# Patient Record
Sex: Female | Born: 1975 | Race: Black or African American | Hispanic: No | Marital: Single | Smoking: Former smoker
Health system: Southern US, Community
[De-identification: ages and names within clinical notes are randomized; demographics above are authoritative.]

## PROBLEM LIST (undated history)

## (undated) DIAGNOSIS — R51 Headache: Secondary | ICD-10-CM

## (undated) DIAGNOSIS — R87629 Unspecified abnormal cytological findings in specimens from vagina: Secondary | ICD-10-CM

## (undated) DIAGNOSIS — E079 Disorder of thyroid, unspecified: Secondary | ICD-10-CM

## (undated) DIAGNOSIS — G43909 Migraine, unspecified, not intractable, without status migrainosus: Secondary | ICD-10-CM

## (undated) DIAGNOSIS — T7840XA Allergy, unspecified, initial encounter: Secondary | ICD-10-CM

## (undated) DIAGNOSIS — R519 Headache, unspecified: Secondary | ICD-10-CM

## (undated) DIAGNOSIS — E039 Hypothyroidism, unspecified: Secondary | ICD-10-CM

## (undated) DIAGNOSIS — D573 Sickle-cell trait: Secondary | ICD-10-CM

## (undated) DIAGNOSIS — O009 Unspecified ectopic pregnancy without intrauterine pregnancy: Secondary | ICD-10-CM

## (undated) DIAGNOSIS — J45909 Unspecified asthma, uncomplicated: Secondary | ICD-10-CM

## (undated) DIAGNOSIS — F418 Other specified anxiety disorders: Secondary | ICD-10-CM

## (undated) HISTORY — DX: Sickle-cell trait: D57.3

## (undated) HISTORY — PX: HX ADENOIDECTOMY: SHX29

## (undated) HISTORY — DX: Hypothyroidism, unspecified: E03.9

## (undated) HISTORY — PX: HX TONSILLECTOMY: SHX27

## (undated) HISTORY — DX: Other specified anxiety disorders: F41.8

## (undated) HISTORY — DX: Unspecified ectopic pregnancy without intrauterine pregnancy: O00.90

## (undated) HISTORY — PX: HX TONSIL AND ADENOIDECTOMY: SHX28

## (undated) HISTORY — DX: Unspecified abnormal cytological findings in specimens from vagina: R87.629

## (undated) HISTORY — DX: Allergy, unspecified, initial encounter: T78.40XA

## (undated) HISTORY — DX: Headache, unspecified: R51.9

## (undated) HISTORY — DX: Disorder of thyroid, unspecified: E07.9

## (undated) HISTORY — DX: Migraine, unspecified, not intractable, without status migrainosus: G43.909

## (undated) HISTORY — DX: Headache: R51

---
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
## 2003-02-22 ENCOUNTER — Ambulatory Visit (HOSPITAL_COMMUNITY): Payer: Self-pay

## 2003-03-15 ENCOUNTER — Ambulatory Visit (HOSPITAL_COMMUNITY): Payer: Self-pay

## 2003-12-14 ENCOUNTER — Emergency Department (HOSPITAL_COMMUNITY): Payer: Self-pay

## 2004-01-06 DIAGNOSIS — R87629 Unspecified abnormal cytological findings in specimens from vagina: Secondary | ICD-10-CM

## 2004-01-06 HISTORY — PX: LEEP: SHX91

## 2004-01-06 HISTORY — DX: Unspecified abnormal cytological findings in specimens from vagina: R87.629

## 2004-09-04 ENCOUNTER — Encounter (FREE_STANDING_LABORATORY_FACILITY): Payer: Self-pay | Admitting: Pathology

## 2008-03-30 ENCOUNTER — Encounter (EMERGENCY_DEPARTMENT_HOSPITAL): Payer: Self-pay

## 2008-03-30 ENCOUNTER — Emergency Department
Admission: EM | Admit: 2008-03-30 | Discharge: 2008-03-30 | Disposition: A | Discharge status: Home / Self Care | Payer: Self-pay | Source: Emergency Department | Attending: Emergency Medicine-WVUH | Admitting: Emergency Medicine-WVUH

## 2008-03-30 ENCOUNTER — Emergency Department (EMERGENCY_DEPARTMENT_HOSPITAL): Payer: Self-pay

## 2008-03-30 DIAGNOSIS — M545 Low back pain, unspecified: Secondary | ICD-10-CM | POA: Insufficient documentation

## 2008-03-30 LAB — URINALYSIS MACROSCOPIC WITH REFLEX TO MICROSCOPIC URINALYSIS (CULTURE NOT PERFORMED)
BLOOD: NEGATIVE
GLUCOSE: NEGATIVE mg/dL
KETONES: NEGATIVE mg/dL
LEUKOCYTES: NEGATIVE
NITRITE: NEGATIVE
PH URINE: 6 (ref 5.0–8.0)
PROTEIN: NEGATIVE mg/dL
SPECIFIC GRAVITY, URINE: 1.01 (ref 1.005–1.030)
UROBILINOGEN: NORMAL mg/dL

## 2008-03-30 NOTE — ED Nurses Note (Signed)
Written and verbal DC orders given with scripts.  Pt verbalizes understanding and denies questions.  DC pt home.

## 2008-03-30 NOTE — ED Nurses Note (Signed)
Pt to xray with PA.

## 2008-03-30 NOTE — ED Nurses Note (Signed)
Pt presents to ED c/o lower back pain since manually lifting garage door 2 days ago.  C/o pain down left leg with numbness and tingling.  Denies urinary changes but states no BM x 2 days.  Awaiting orders.

## 2008-03-30 NOTE — ED Provider Notes (Addendum)
HPI  33 yo F c/o low back pain x 2 days.  Pt states she attempted to open a garage door and has pain in her low back since.  Denies radiation, sensory change or weakness.  Denies bowel/bladder incontinence.  States she occasionally feels tingling in her left buttock.  Denies all other sxs.        Review of Systems   Constitutional: Negative for fever and chills.   Cardiovascular: Negative for chest pain and palpitations.   Respiratory: Is not experiencing shortness of breath.   Gastrointestinal: Negative for nausea, vomiting, abdominal pain and diarrhea.   Genitourinary: Negative for dysuria.   Musculoskeletal: Positive for back pain. Negative for falls.   Neurological: Positive for tingling (occasional in left buttock). Negative for sensory change.   All other systems reviewed and are negative.          History: Past Medical History:  No past medical history on file.    Past Surgical History:  No past surgical history on file.    Social History:  History   Substance Use Topics   . Tobacco Use: Not on file   . Alcohol Use: Not on file       History   Drug Use Not on file         Family History:  No family history on file.    No current outpatient prescriptions on file.       Allergies   Allergen Reactions   . Sulfa (Sulfonamide Antibiotics)        Filed Vitals:    03/30/2008 12:28 PM   BP: 124/88   Pulse: 69   Temp: 36.7 C (98.1 F)   Resp: 18   Weight: 72.576 kg (160 lb)   SpO2: 100%             Physical Exam   Nursing note and vitals reviewed.  Constitutional: She is oriented. She appears well-developed and well-nourished. She appears not diaphoretic. No distress.   Cardiovascular: Normal rate, regular rhythm and normal heart sounds.    Pulmonary/Chest: Effort normal and breath sounds normal. No respiratory distress. She has no wheezes.   Musculoskeletal:        Lumbar back: She exhibits decreased range of motion (2/2 pain), tenderness (paraspinal muscles) and spasm.   Neurological: She is alert and oriented.    Skin: Skin is warm and dry. She is not diaphoretic.   Psychiatric: She has a normal mood and affect. Her behavior is normal. Judgment and thought content normal.           ED Course:    Results:  Results for orders placed during the hospital encounter of 03/30/2008 (from the past 12 hours)    -URINALYSIS W/CULTURE (INPT ROUTINE)     CHARACTER                     CLEAR                            COLOR                                                                       Value: LIGHT YELLOW  SPECIFIC GRAVITY, URINE       1.010     Low: 1.005  High: 1.030     GLUCOSE (mg/dL)               NEGATIVE                          BILIRUBIN                     NEGATIVE                          KETONES (mg/dL)               NEGATIVE                          BLOOD                         NEGATIVE                          PH URINE                      6.0       Low: 5.0  High: 8.0     PROTEIN (mg/dL)               NEGATIVE                          UROBILINOGEN (mg/dL)          NORMAL                           NITRITE                       NEGATIVE                          LEUKOCYTES                    NEGATIVE                           ED Course:  Pt seen in ED Fast Track      Evaluation Plan:  Low back pain    Sx Tx    Flexeril 10 mg # 15  Naproxen 500 mg # 60  Rx Bawi Lakins MD    F/U Sports Medicine 2 weeks if no improvement in sxs    F/U ED if sxs worsen esp if bowel/bladder incontinence develop        I did not see this patient but was available for consultation if necessary.

## 2011-08-14 ENCOUNTER — Ambulatory Visit: Payer: Self-pay

## 2011-11-09 ENCOUNTER — Ambulatory Visit: Payer: Self-pay | Attending: Internal Medicine

## 2011-11-09 DIAGNOSIS — Z0000 Encounter for general adult medical examination without abnormal findings: Secondary | ICD-10-CM | POA: Insufficient documentation

## 2011-11-09 LAB — LIPID PANEL
CHOLESTEROL: 165 mg/dL (ref <200)
HDL-CHOLESTEROL: 50 mg/dL (ref >39)
LDL (CALCULATED): 85 mg/dL (ref <100)
NON - HDL (CALCULATED): 115 mg/dL (ref <190)
TRIGLYCERIDES: 151 mg/dL — ABNORMAL HIGH (ref <150)
VLDL (CALCULATED): 30 mg/dL — ABNORMAL HIGH (ref <30)

## 2011-11-09 LAB — GLUCOSE FASTING: GLUCOSE, FASTING: 86 mg/dL (ref 70–105)

## 2012-03-04 ENCOUNTER — Ambulatory Visit (INDEPENDENT_AMBULATORY_CARE_PROVIDER_SITE_OTHER): Payer: 59

## 2012-03-04 ENCOUNTER — Other Ambulatory Visit (INDEPENDENT_AMBULATORY_CARE_PROVIDER_SITE_OTHER): Payer: 59

## 2012-03-04 ENCOUNTER — Encounter (INDEPENDENT_AMBULATORY_CARE_PROVIDER_SITE_OTHER): Payer: Self-pay

## 2012-03-04 VITALS — BP 137/86 | HR 82 | Temp 97.7°F | Resp 16 | Ht 64.17 in | Wt 193.0 lb

## 2012-03-04 MED ORDER — TRAMADOL 50 MG TABLET
50.00 mg | ORAL_TABLET | Freq: Every evening | ORAL | Status: DC | PRN
Start: 2012-03-04 — End: 2012-12-22

## 2012-03-04 NOTE — Progress Notes (Signed)
Subjective:     Patient ID:  Elizabeth Landry is an 37 y.o. female     Chief Complaint:    Chief Complaint   Patient presents with   . Ankle Injury     left ankle after falling down steps 2 weeks ago       HPI Comments: Pt twisted her L ankle while coming down a flight of steps about 2 weeks ago. Had been applying ice packs, used an ankle brace with some relief. 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 Still with pain and swelling on ankle    Patient is a 37 y.o. female presenting with Ankle Injury.   Ankle Injury  This is a new problem. The current episode started more than 1 week ago. The problem occurs daily. The problem has been gradually worsening. Pertinent negatives include no chest pain, no headaches and no shortness of breath. The symptoms are aggravated by exertion and standing. The symptoms are relieved by rest and NSAIDs. She has tried rest for the symptoms. The treatment provided mild relief.       Review of Systems   Constitutional: Negative for fever and chills.   HENT: Negative for ear pain, congestion and sore throat.    Eyes: Negative for pain, discharge and redness.   Respiratory: Negative for cough, sputum production, shortness of breath and wheezing.    Cardiovascular: Negative for chest pain.   Gastrointestinal: Negative for nausea, vomiting and diarrhea.   Musculoskeletal: Positive for joint pain and falls.   Skin: Negative for itching and rash.   Neurological: Negative for headaches.       Objective:     Physical Exam   Constitutional: She appears well-developed and well-nourished.   HENT:   Head: Normocephalic and atraumatic.   Right Ear: External ear normal.   Left Ear: External ear normal.   Eyes: Conjunctivae and EOM are normal.   Neck: Normal range of motion. Neck supple.   Ortho/Musculoskeletal:   Left ankle:    She exhibits swelling.     She exhibits normal range of motion and no ecchymosis.      The patient is experiencing tenderness in the lateral malleolus and medial malleolus.    Achilles tendon normal.     Left foot:   Normal.        Legs:  Neurological: She is alert.   Skin: Skin is warm.       .    Vital signs:        Filed Vitals:    03/04/12 0952   BP: 137/86   Pulse: 82   Temp: 36.5 C (97.7 F)   TempSrc: Tympanic   Resp: 16   Height: 1.63 m (5' 4.17")   Weight: 87.544 kg (193 lb)   SpO2: 100%       I reviewed and confirmed the patient's past medical history taken by the nurse or medical assistant with the addition of the following:    Past Medical History:           Past Medical History   Diagnosis Date   . Stroke      Past Surgical History:        History reviewed. No pertinent past surgical history.  Allergies:        Allergies   Allergen Reactions   . Sulfa (Sulfonamides)      Medications:         Current Outpatient Prescriptions   Medication Sig   .  escitalopram (LEXAPRO) 10 mg Oral Tablet Take 10 mg by mouth Once a day   . levothyroxine (SYNTHROID) 25 mcg Oral Tablet Take 25 mcg by mouth Once a day   . naproxen (NAPROSYN) 375 mg Oral Tablet Take 1 Tab (375 mg total) by mouth Every 12 hours as needed for Pain for 10 days   . traMADol (ULTRAM) 50 mg Oral Tablet Take 1 Tab (50 mg total) by mouth Every night as needed for 10 doses     Social History:         History   Substance Use Topics   . Smoking status: Never Smoker    . Smokeless tobacco: Not on file   . Alcohol Use: Yes     Family History:     No family history on file.    Data Reviewed     Radiography:  suspicious avulsion fx on L fibular area    Course:      Condition at discharge: Good     Differential Diagnosis:       Sprain vs fracture    Orders Placed This Encounter   . Left Ankle x-ray   . AMB CONSULT/REFERRAL ORTHOPAEDICS/SPORTS MEDICINE   . naproxen (NAPROSYN) 375 mg Oral Tablet   . traMADol (ULTRAM) 50 mg Oral Tablet   . DME CAM WALKER BOOT (REQUISITION/REQUEST)              Assessment & Plan:      1. Ankle pain (719.47)  levothyroxine (SYNTHROID) 25 mcg Oral Tablet, escitalopram (LEXAPRO) 10 mg Oral Tablet, XR ANKLE LEFT, naproxen (NAPROSYN) 375 mg Oral Tablet, traMADol (ULTRAM) 50 mg Oral Tablet, DME CAM WALKER BOOT (REQUISITION/REQUEST), AMB CONSULT/REFERRAL ORTHOPAEDICS/SPORTS MEDICINE   2. Avulsion fracture of ankle (824.8)  levothyroxine (SYNTHROID) 25 mcg Oral Tablet, escitalopram (LEXAPRO) 10 mg Oral Tablet, XR ANKLE LEFT, naproxen (NAPROSYN) 375 mg Oral Tablet, traMADol (ULTRAM) 50 mg Oral Tablet, DME CAM WALKER BOOT (REQUISITION/REQUEST), AMB CONSULT/REFERRAL ORTHOPAEDICS/SPORTS MEDICINE      RICE;  camwalker boot for next week.  NSAIDS  Ortho appt    Go to Emergency Department immediately for further work up if any concerning symptoms.  Plan was discussed and patient verbalized understanding.  If symptoms are worsening or not improving the patient should return to the Urgent Care for further evaluation.  Primus Bravo, MD 03/04/2012, 10:24 AM

## 2012-03-04 NOTE — Patient Instructions (Addendum)
[facility]  [address] [place], [place] [address]  Phone: [phone]  Fax: [phone]  [email or web address]               Open Daily 8:00am - 8:00pm ~ Closed Thanksgiving and Christmas Day     Attending Caregiver: Primus Bravo, MD      Today's orders:   Orders Placed This Encounter   . Left Ankle x-ray   . AMB CONSULT/REFERRAL ORTHOPAEDICS/SPORTS MEDICINE   . naproxen (NAPROSYN) 375 mg Oral Tablet   . traMADol (ULTRAM) 50 mg Oral Tablet   . DME CAM WALKER BOOT (REQUISITION/REQUEST)        Prescription(s) E-Rx to:  [facility] [place], [place] - [address]    ________________________________________________________________________  Short Term Disability and Family Medical Leave Act  [facility] does NOT provide assistance with any disability applications.  If you feel your medical condition requires you to be on disability, you will need to follow up with  Your primary care physician or a specialist.  We apologize for any inconvenience.    For Medication Prescribed by [facility]:  As an Urgent Care facility, our clinic does NOT offer prescription refills over the telephone.    If you need more of the medication one of our medical providers prescribed, you will  Either need to be re-evaluated by [place] or see your primary care physician.    ________________________________________________________________________      It is very important that we have a phone number that is the single best way to contact you in the event that we become aware of important clinical information or concerns after your discharge.  If the phone number you provided at registration is NOT this number you should inform staff and registration prior to leaving.       Your treatment and evaluation today was focused on identifying and treating potentially emergent conditions based on your presenting signs, symptoms, and history.  The resulting initial clinical impression and treatment plan is not intended to be definitive or a substitute for a full physical examination and evaluation by your primary care provider.  If your symptoms persist, worsen, or you develop any new or concerning symptoms, you need to be evaluated.      If you received x-rays during your visit, be aware that the final and formal interpretation of those films by a radiologist may occur after your discharge.  If there is a significant discrepancy identified after your discharge, we will contact you at th telephone number provided at registration.      If you received a pelvic exam, you may have cultures pending for sexually transmitted diseases.  Positive cultures are reported to the [facility] Department of Health as required by state law.  You should be contacted if you cultures are positive.  We will not contact you if they are negative.  You may contact the Health Information Management Office of [facility] to get a copy of your results.  You did NOT receive a PAP smear (the screening test for cervical).  This specific test for women is best performed by your gynecologist or primary care provider when indicated.      If you are over 64 year old, we cannot discuss your personal health information with a parent, spouse, family member, or anyone else without your express consent.  This does not include those who have legitimate access to your records and information to assist in your  care under the provisions of HIPAA [facility] Portability and Accountability Act) law, or those to whom you have previously given express written consent to do so, such a legal guardian or Power of [place].       You may have received medication that may cause you to feel drowsy and/or light headed for several hours.  You may even experience some amnesia of your stay.  You should avoid operating a motor vehicle or performing any activity requiring complete alertness or coordination until you feel fully awake (approximately 24-48 hours).  Avoid alcoholic beverages.  You may also have a dry mouth for several hours.  This is a normal side effect and will disappear as the effects of the medication wear off.      Instructions discussed with patient upon discharge by clinical staff with all questions answered.  Please call [facility] [phone]) if any further questions.  Go immediately to the emergency department if any concern or worsening symptoms.      Primus Bravo, MD 03/04/2012, 10:24 AM    Ankle Fracture  A fracture is a break in the bone. Most of the time, surgery is not needed for a broken ankle. A cast, splint, ankle brace, or walking boot may be used to heal the break. A broken ankle can heal in 6 to 12 weeks.  HOME CARE   Do not walk on your broken ankle until told by your doctor.   Use crutches as told by your doctor.   Keep your ankle raised (elevated) to the level of the chest as much as possible.   Take medicine as told by your doctor.   Do not smoke.   Eat healthy foods.   Get follow-up care as told by your doctor.   If you do not need a cast, splint, brace, or boot:   Apply an ice pack to the ankle as told by your doctor.   If you have an ankle brace or walking boot:   Only remove it as told by your doctor.   Apply an ice pack to the ankle as told by your doctor.   If you have a splint or cast:   Rest your plaster splint or cast only on a pillow until it is fully hardened. It may take 3 days for the plaster to harden.   Do not scratch under the splint or cast.   Do not stick anything inside the splint or cast to scratch your skin.    Keep sand and dirt out of the inside of the splint or cast.   Do not pull out the padding from the splint or cast.   Keep your splint or cast dry and clean. Cover it with a plastic bag during showers.   Check the skin around the splint or cast every day. You may put lotion on sore areas.   Do not put pressure on any part of your cast or splint. It may break.  GET HELP RIGHT AWAY IF:    You have severe pain, burning or stinging.   Your toes turn blue or gray.   Your toes feel cold or numb.   You cannot move your toes.   Your splint or cast is too tight.   Your splint or cast breaks.   There is a bad smell or yellowish white fluid (pus) coming from under the splint or cast.  MAKE SURE YOU:    Understand these instructions.   Will watch your condition.  Will get help right away if you are not doing well or get worse.  Document Released: 10/19/2008 Document Revised: 03/16/2011 Document Reviewed: 10/24/2008  [facility] Patient Information 2013 [place], [place].

## 2012-03-15 ENCOUNTER — Encounter (HOSPITAL_BASED_OUTPATIENT_CLINIC_OR_DEPARTMENT_OTHER): Payer: Self-pay

## 2012-03-15 ENCOUNTER — Ambulatory Visit
Admission: RE | Admit: 2012-03-15 | Discharge: 2012-03-15 | Disposition: A | Discharge status: Home / Self Care | Payer: 59 | Source: Ambulatory Visit

## 2012-03-15 ENCOUNTER — Ambulatory Visit (HOSPITAL_BASED_OUTPATIENT_CLINIC_OR_DEPARTMENT_OTHER): Payer: 59

## 2012-03-15 VITALS — BP 134/84 | HR 85 | Temp 98.2°F | Ht 64.0 in | Wt 193.0 lb

## 2012-03-15 DIAGNOSIS — J45909 Unspecified asthma, uncomplicated: Secondary | ICD-10-CM | POA: Insufficient documentation

## 2012-03-15 DIAGNOSIS — E039 Hypothyroidism, unspecified: Secondary | ICD-10-CM | POA: Insufficient documentation

## 2012-03-15 DIAGNOSIS — M25579 Pain in unspecified ankle and joints of unspecified foot: Secondary | ICD-10-CM | POA: Insufficient documentation

## 2012-03-15 NOTE — H&P (Addendum)
[facility]                              DEPARTMENT OF [place], [place] [address]                                PATIENT NAME: Elizabeth Landry, Elizabeth Landry  HOSPITAL UEAVWU:[id number]  DATE OF SERVICE:03/15/2012  DATE OF BIRTH: 15-Nov-1975    HISTORY AND PHYSICAL    CHIEF COMPLAINT:  Left ankle pain.    HISTORY OF PRESENT ILLNESS:  The patient states that early in February she fell down a few stairs approximately 3-4 at her friend's house while carrying a child that was too large to be carried.  She states that she hurt her left ankle at that time.  She thought it was just a severe strain and did not think anything of it; however, she continued to have pain and so she eventually decided to go to a [facility] facility where they took x-rays.  They told her that she had a possible small avulsion fracture, was placed in a boot and this all occurred on March 04, 2012.  They also scheduled her an appointment with Dr. Cherylynn Ridges and she is here today for further evaluation of her left ankle.  She states that she has continued to have pain.  She does have some swelling.  She has been doing some ice and elevation as well as naproxen for the pain.  She has been keeping it in a boot for approximately the past 3 weeks which she feels the pain is improving and she feels like the ankle in general is improving as far as strength and stability is concerned.    PAST MEDICAL HISTORY:  Depression, asthma, seasonal allergies and hypothyroidism.    MEDICATIONS:  1.  Escitalopram 10 mg.  2.  Levothyroxine 25 mcg.  3.  Prenatal vitamin.  4.  Folic acid 400 mg.  5.  Tramadol 50 mg.  6.  Naproxen 375 mg.    PAST SURGICAL HISTORY:  Tonsillectomy performed in 1984.      ALLERGIES:  She does have an allergy to sulfa which causes hives.  She does not have an allergy to latex.     SOCIAL HISTORY:  She does not use tobacco.  She does drink alcohol, approximately 1 drink every other weekend and she does not use any other drugs.  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    FAMILY HISTORY:  Significant for bladder problems, diabetes, bowel problems as well as high blood pressure.    REVIEW OF SYSTEMS:  Negative for fevers, chills.  Negative for vision changes, hearing problems, chest pain, shortness of breath, heart burn gastrointestinal symptoms, urinary problems.  Positive for muscle and joint pain as described in history of present illness.  She states that she does have some occasional numbness in her legs.  She states she does not have any bleeding issues.    OBJECTIVE:  The patient is in no distress, resting comfortably on the exam room, breathing is unlabored.  On examination of the left ankle she does have mild tenderness to palpation over the medial malleolus as well  as over the lateral malleolus.  She has very minimal swelling, specifically over the lateral malleolus.  She does not have any obvious deformity and no skin lesions, bruising or abrasions.  Her sensation is intact to light touch over all nerve distributions of the left foot.  She has palpable DP and PT pulses in that left foot.  She is able to stand with no issues.  She is able to go up on and do a single leg heel raise on both lower extremities, not quite as stable on the left as on the right and she is able to stand on one foot on the right and not quite as easily on the left but she is able to do that and maintain her balance.  On examination of the ligaments of her ankles they  are stable to the anterior and posterior drawer test of the ankle of the left side in comparison with the right side.  She has brisk capillary refill over all toes.     IMAGING:  Today, x-rays of the left ankle and foot were performed that show a possible very small avulsion fracture; however, this is likely just some calcification of the ligament.      ASSESSMENT AND PLAN:  We believe that [name] probably had more of a severe ankle sprain or strain as opposed to an avulsion fracture; however, this is a possibility.  The x-rays are not entirely convincing that there is a small piece of bone in there, however, it is certainly possible and this is probably more of a calcification and fairly insignificant finding; however, based on her symptoms we are pleased that she has been in a boot for the past 3 weeks and this was appropriate immobilization for this sort of injury and we believe that she is doing very well now.  We are going to go ahead and transition her to an ankle brace.  We are going to write her a script for 1 time of physical therapy so that she can go and do some of the home exercises and learn all that for formal physical therapy and she continue those at home as she is very motivated and wants to do better on her own.  She states that she wants to continue to improve.  She has no questions or concerns at this time so we are going to see her back on an as needed basis as we do not need to follow this but if she needs [place] again, she can call and schedule an appointment.      Gershon Cull, MD  Resident  [facility] of Orthopaedics    Heide Guile, MD  Assistant Professor  [facility] Department of Orthopaedics    HQ/ION/[id number]; D: 03/15/2012 08:56:55; T: 03/15/2012 09:31:26    cc: [facility]       [address]       [place], [place] [address]             I saw and examined the patient.  I directly supervised the resident's activities and procedures.  I reviewed the resident's note.  I agree with the findings and plan of care as documented in the resident's note.    Tarry Kos, MD  Assistant Professor  Chief of Foot & Ankle Surgery   Department of Orthopaedics  [facility]  [facility] [employer]  03/18/2012, 12:28 PM

## 2012-07-14 ENCOUNTER — Ambulatory Visit: Payer: 59 | Attending: PREVENTIVE MEDICINE-OCCUPATIONAL MEDICINE

## 2012-07-14 DIAGNOSIS — Z0000 Encounter for general adult medical examination without abnormal findings: Secondary | ICD-10-CM | POA: Insufficient documentation

## 2012-07-14 LAB — LIPID PANEL
CHOLESTEROL: 194 mg/dL (ref <200)
HDL-CHOLESTEROL: 47 mg/dL (ref >39)
LDL (CALCULATED): 129 mg/dL — ABNORMAL HIGH (ref <100)
NON - HDL (CALCULATED): 147 mg/dL (ref <190)
TRIGLYCERIDES: 90 mg/dL (ref <150)
VLDL (CALCULATED): 18 mg/dL (ref <30)

## 2012-07-14 LAB — GLUCOSE FASTING: GLUCOSE, FASTING: 91 mg/dL (ref 70–105)

## 2012-10-29 ENCOUNTER — Other Ambulatory Visit: Payer: Self-pay

## 2012-10-31 ENCOUNTER — Other Ambulatory Visit: Payer: Self-pay

## 2012-12-22 ENCOUNTER — Ambulatory Visit
Admission: RE | Admit: 2012-12-22 | Discharge: 2012-12-22 | Disposition: A | Discharge status: Home / Self Care | Payer: 59 | Source: Ambulatory Visit | Attending: Internal Medicine | Admitting: Internal Medicine

## 2012-12-22 ENCOUNTER — Encounter (INDEPENDENT_AMBULATORY_CARE_PROVIDER_SITE_OTHER): Payer: Self-pay | Admitting: Family Medicine

## 2012-12-22 ENCOUNTER — Ambulatory Visit (HOSPITAL_BASED_OUTPATIENT_CLINIC_OR_DEPARTMENT_OTHER): Payer: 59 | Admitting: Family Medicine

## 2012-12-22 VITALS — BP 108/70 | HR 89 | Temp 98.7°F | Ht 63.7 in | Wt 192.7 lb

## 2012-12-22 DIAGNOSIS — Z9189 Other specified personal risk factors, not elsewhere classified: Secondary | ICD-10-CM

## 2012-12-22 DIAGNOSIS — Z205 Contact with and (suspected) exposure to viral hepatitis: Secondary | ICD-10-CM

## 2012-12-22 DIAGNOSIS — D573 Sickle-cell trait: Secondary | ICD-10-CM | POA: Insufficient documentation

## 2012-12-22 DIAGNOSIS — Z20828 Contact with and (suspected) exposure to other viral communicable diseases: Secondary | ICD-10-CM | POA: Insufficient documentation

## 2012-12-22 DIAGNOSIS — R631 Polydipsia: Secondary | ICD-10-CM | POA: Insufficient documentation

## 2012-12-22 DIAGNOSIS — Z0000 Encounter for general adult medical examination without abnormal findings: Secondary | ICD-10-CM

## 2012-12-22 DIAGNOSIS — F418 Other specified anxiety disorders: Secondary | ICD-10-CM | POA: Insufficient documentation

## 2012-12-22 DIAGNOSIS — E039 Hypothyroidism, unspecified: Secondary | ICD-10-CM | POA: Insufficient documentation

## 2012-12-22 DIAGNOSIS — S82899A Other fracture of unspecified lower leg, initial encounter for closed fracture: Secondary | ICD-10-CM

## 2012-12-22 LAB — BASIC METABOLIC PANEL, FASTING
ANION GAP: 8 mmol/L (ref 4–13)
BUN/CREAT RATIO: 16 (ref 6–22)
BUN: 13 mg/dL (ref 8–25)
CALCIUM: 9.6 mg/dL (ref 8.5–10.4)
CARBON DIOXIDE: 28 mmol/L (ref 22–32)
CHLORIDE: 106 mmol/L (ref 96–111)
CREATININE: 0.8 mg/dL (ref 0.49–1.10)
ESTIMATED GLOMERULAR FILTRATION RATE: >59 ml/min/1.73m2 (ref >59)
GLUCOSE, FASTING: 93 mg/dL (ref 70–105)
POTASSIUM: 4 mmol/L (ref 3.5–5.1)
SODIUM: 142 mmol/L (ref 136–145)

## 2012-12-22 LAB — HEPATIC FUNCTION PANEL
ALBUMIN: 3.8 g/dL (ref 3.5–5.0)
ALKALINE PHOSPHATASE: 93 U/L (ref <150)
ALT (SGPT): 16 U/L (ref <55)
AST (SGOT): 14 U/L (ref 8–41)
BILIRUBIN, TOTAL: 1.5 mg/dL — ABNORMAL HIGH (ref 0.3–1.3)
BILIRUBIN,CONJUGATED: 0.4 mg/dL — ABNORMAL HIGH (ref <0.3)
TOTAL PROTEIN: 7.6 g/dL (ref 6.4–8.3)

## 2012-12-22 LAB — THYROID STIMULATING HORMONE (SENSITIVE TSH): TSH: 0.785 u[IU]/mL (ref 0.350–5.000)

## 2012-12-22 LAB — LIPID PANEL
CHOLESTEROL: 173 mg/dL (ref 100–200)
HDL-CHOLESTEROL: 45 mg/dL (ref >39)
LDL (CALCULATED): 111 mg/dL — ABNORMAL HIGH (ref <100)
NON - HDL (CALCULATED): 128 mg/dL (ref <190)
TRIGLYCERIDES: 86 mg/dL (ref <150)
VLDL (CALCULATED): 17 mg/dL (ref <30)

## 2012-12-22 LAB — CBC/DIFF
BASOPHILS: 1 %
BASOS ABS: 0.066 THOU/uL (ref 0.0–0.2)
EOS ABS: 0.161 THOU/uL (ref 0.0–0.5)
EOSINOPHIL: 2 %
HCT: 36.7 % (ref 33.5–45.2)
HGB: 12.5 g/dL (ref 11.2–15.2)
LYMPHOCYTES: 28 %
LYMPHS ABS: 2.581 THOU/uL (ref 1.0–4.8)
MCH: 26 pg — ABNORMAL LOW (ref 27.4–33.0)
MCHC: 34.2 g/dL (ref 32.5–35.8)
MCV: 76.1 fL — ABNORMAL LOW (ref 78–100)
MONOCYTES: 6 %
MONOS ABS: 0.532 THOU/uL (ref 0.3–1.0)
MPV: 8.1 fL (ref 7.5–11.5)
PLATELET COUNT: 328 THOU/uL (ref 140–450)
PMN ABS: 6.007 10*3/uL (ref 1.5–7.7)
PMN'S: 63 %
RBC: 4.82 MIL/uL (ref 3.63–4.92)
RDW: 14.1 % (ref 12.0–15.0)
WBC: 9.3 THOU/uL (ref 3.5–11.0)

## 2012-12-22 LAB — HEPATITIS C ANTIBODY SCREEN WITH REFLEX TO HCV PCR: HEPATITIS C ANTIBODY: NEGATIVE

## 2012-12-22 LAB — HIV1/HIV2 SCREEN, COMBINED ANTIGEN AND ANTIBODY: HIV SCREEN, COMBINED ANTIGEN & ANTIBODY: NEGATIVE

## 2012-12-22 LAB — THYROXINE, FREE (FREE T4): THYROXINE, FREE (FREE T4): 0.85 ng/dL (ref 0.70–1.25)

## 2012-12-22 MED ORDER — ESCITALOPRAM 10 MG TABLET
10.0000 mg | ORAL_TABLET | Freq: Every day | ORAL | Status: DC
Start: 2012-12-22 — End: 2013-02-23

## 2012-12-22 MED ORDER — LEVOTHYROXINE 25 MCG TABLET
25.0000 ug | ORAL_TABLET | Freq: Every morning | ORAL | Status: DC
Start: 2012-12-22 — End: 2013-03-03

## 2012-12-22 NOTE — H&P (Addendum)
Medical Group Practice  Operated by [facility]  New Patient History and Physical       Date:   12/22/2012  Name: Elizabeth Landry  Age: 37 y.o.    Chief Complaint: Hypothyroidism, Depression and New Patient    History of Present Illness:    37 yo female with PMH hypothyroidism, anxiety with depression, and sickle cell trait presents to establish care. Pt is former patient of Dr. Durwin Nora at [facility]. Pt reports she has not taken lexapro 10 mg po daily or synthroid 25 mcg po daily since May 2014. She reports she did not have any refills for the medications. Pt was started on both medications in ~ Nov. 2013. Pt reports depressed mood since being off Lexapro. Some excessive guilt. Does have some generalized anxiety as well. Pt reports boyfriend started back doing IV opiates and is now in jail which has caused her some stress. Pt reports early morning awakening. She has decreased appetite but has gained 15 pounds. No SI, hallucinations or delusions. Pt reports depression more when seasons change. Denies changes in hair/skin, dysphagia, chest pain, SOB, palpitations or vision changes. 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 Pt requesting refills for lexapro and synthroid. No other pertinent positive ROS.    Past Medical History  Past Medical History   Diagnosis Date    Depression with anxiety     Hypothyroidism, adult     Ectopic pregnancy      treated with methotrexate    Abnormal Pap smear 2006     normal after, reports colpo, follows with Dr. Jeanella Cara    Sickle cell trait     Past Surgical History   Procedure Laterality Date    Hx tonsil and adenoidectomy          Current Outpatient Prescriptions   Medication Sig    escitalopram oxalate (LEXAPRO) 10 mg Oral Tablet Take 1 Tab (10 mg total) by mouth Once a day    levothyroxine (SYNTHROID) 25 mcg Oral Tablet Take 1 Tab (25 mcg total) by mouth Every morning     Allergies   Allergen Reactions    Sulfa (Sulfonamides) Nausea/ Vomiting and Hives/ Urticaria       Family  History  Family History   Problem Relation Age of Onset    Stroke Mother     High Cholesterol Mother     Diabetes Mother     Diabetes Father      Social History  History     Social History    Marital Status: Single     Spouse Name: N/A     Number of Children: N/A    Years of Education: N/A     Occupational History     [employer].     pharmacy tech     Social History Main Topics    Smoking status: Former Smoker -- 1.00 packs/day for 10 years     Types: Cigarettes     Quit date: 12/23/2007    Smokeless tobacco: Not on file    Alcohol Use: 0.0 oz/week      Comment: occasional    Drug Use: No    Sexual Activity: Not on file     Other Topics Concern    Not on file     Social History Narrative    Lives with son.      Review of Systems    Constitutional: positive for weight gain, negative for fevers and chills  Eyes: negative for visual disturbance  and irritation  Ears, nose, mouth, throat, and face: negative for hearing loss and tinnitus  Respiratory: negative for cough or dyspnea on exertion  Cardiovascular: negative for chest pain and palpitations  Gastrointestinal: negative for nausea and vomiting  Genitourinary:negative for frequency and dysuria  Integument/breast: negative for rash and skin lesion(s)  Hematologic/lymphatic: negative for easy bruising and bleeding  Musculoskeletal:negative for myalgias and arthralgias  Neurological: negative for headaches and dizziness  Behavioral/Psych: positive for anxiety and depression as per HPI  Endocrine: positive for diabetic symptoms including polydipsia, negative for diabetic symptoms including polyphagia      Examination:  BP 108/70   Pulse 89   Temp(Src) 37.1 C (98.7 F) (Tympanic)   Ht 1.618 m (5' 3.7")   Wt 87.4 kg (192 lb 10.9 oz)   BMI 33.39 kg/m2   SpO2 99%  General: mildly obese, appears stated age, no distress and vital signs reviewed and discussed with patient  Eyes: Conjunctiva clear., Pupils equal and round.   HENT:Head atraumatic and  normocephalic, ENT without erythema or injection, mucous membranes moist.  Neck: No JVD or thyromegaly or lymphadenopathy  Lungs: Clear to auscultation bilaterally.   Cardiovascular: regular rate and rhythm, S1, S2 normal, no murmur, click, rub or gallop  Abdomen: Soft, non-tender, Bowel sounds normal, non-distended  Genito-urinary: Deferred  Extremities: No cyanosis or edema  Skin: Skin warm and dry  Neurologic: Alert and oriented X 3, normal strength and tone. Normal symmetric reflexes. Normal coordination and gait  Lymphatics: No lymphadenopathy  Psychiatric: mood depressed, affect congruent, denies SI/hallucinations/delusions    Data reviewed:    Assessment and Plan  1. Hypothyroidism    2. hypothyroidism    3. Polydipsia    4. Healthcare maintenance    5. Sickle cell trait    6. Exposure to hepatitis C    7. History of sexual intercourse    8. Anxiety associated with depression      Hypothyroidism  - Check TSH and free T4  - Restart synthyroid 25 mc po qam    Anxiety with depression  - no thoughts of self harm  - restart lexapro 10 mg po daily  - discussed phototherapy at home as pt describes some seasonal component    Exposure to hepatitis C and intercourse with IVDU  - check hepatic function panel  - check hepatitis C and HIV screen, discussed testing with patient    Polydipsia  - check fasting BMP to screen for diabeteis    Sickle cell trait  - check CBC to screen for anemia as menstruating female    Healthcare maintenance  - tetantus received within 10 years but pending receiving records from prior PCP  - received flu shot Oct. 2014  - receives gyn care from Dr. Jeanella Cara with last pap normal in 2013 per pt  - check fasting lipid panel and 25-OH vitamin D    RTC 2 months    Wilburt Finlay, MD    This is a post dated note for patient visit on 12/22/12  I discussed the patient's care with the Resident prior to the patient leaving the clinic. Any significant discussion points are noted .    Asencion Noble, MD 12/31/2012, 11:49 AM

## 2012-12-26 LAB — VITAMIN D, SERUM (25 HYDROXYVITAMIN D2 AND D3 BY MS)
25 HYDROXYVITAMIN D2/D3,total: 18.1 ng/mL (ref <100)
25 HYDROXYVITAMIN D2: <4 ng/mL
25 HYDROXYVITAMIN D3: 18.1 ng/mL

## 2013-01-13 ENCOUNTER — Telehealth (INDEPENDENT_AMBULATORY_CARE_PROVIDER_SITE_OTHER): Payer: Self-pay | Admitting: Family Medicine

## 2013-01-13 NOTE — Telephone Encounter (Signed)
[facility] for your [facility]. Placing it in your [facility] mailbox.   Rosita KeaJamie Arnold, LPN 1/6/10961/09/2013, 0:453:38 PM

## 2013-02-20 ENCOUNTER — Telehealth (INDEPENDENT_AMBULATORY_CARE_PROVIDER_SITE_OTHER): Payer: Self-pay | Admitting: Family Medicine

## 2013-02-20 ENCOUNTER — Other Ambulatory Visit (INDEPENDENT_AMBULATORY_CARE_PROVIDER_SITE_OTHER): Payer: Self-pay | Admitting: Family Medicine

## 2013-02-20 DIAGNOSIS — E039 Hypothyroidism, unspecified: Secondary | ICD-10-CM

## 2013-02-20 NOTE — Telephone Encounter (Signed)
Called patient about upcoming appointment and need to check TSH and free T4. Pt also with low vitamin D and will need supplementation. Left message on patient's cell phone as she did not answer. Asked to complete lab prior to appointment and we will discuss vitamin D replacement at that time as well. Would consider 50,000 IU weekly for 8 weeks then 800 IU daily after checking level.     Wilburt FinlayJason Willis Alexzavier Girardin, MD 02/20/2013, 15:31

## 2013-02-20 NOTE — Telephone Encounter (Signed)
Added TPO antibodies to lab orders as pt's TSH was normal at last check in December although it was stated she was not on synthroid since May 2014. Pt to have TSH and free T4 checked as well. Would not continue treating if TPO antibodies negative. Would consider stop supplementation and check TSH and free T4 in a few months. Will decide pending lab results.    Wilburt FinlayJason Willis Avrey Flanagin, MD 02/20/2013, 15:44

## 2013-02-23 ENCOUNTER — Encounter (INDEPENDENT_AMBULATORY_CARE_PROVIDER_SITE_OTHER): Payer: Self-pay | Admitting: Family Medicine

## 2013-02-23 ENCOUNTER — Ambulatory Visit
Admission: RE | Admit: 2013-02-23 | Discharge: 2013-02-23 | Disposition: A | Discharge status: Home / Self Care | Payer: 59 | Source: Ambulatory Visit | Attending: Internal Medicine | Admitting: Internal Medicine

## 2013-02-23 ENCOUNTER — Ambulatory Visit (INDEPENDENT_AMBULATORY_CARE_PROVIDER_SITE_OTHER): Payer: 59 | Admitting: Family Medicine

## 2013-02-23 VITALS — BP 102/60 | HR 83 | Temp 97.7°F | Ht 64.53 in | Wt 192.9 lb

## 2013-02-23 DIAGNOSIS — S82899A Other fracture of unspecified lower leg, initial encounter for closed fracture: Secondary | ICD-10-CM

## 2013-02-23 DIAGNOSIS — E559 Vitamin D deficiency, unspecified: Secondary | ICD-10-CM | POA: Insufficient documentation

## 2013-02-23 DIAGNOSIS — E039 Hypothyroidism, unspecified: Secondary | ICD-10-CM | POA: Insufficient documentation

## 2013-02-23 DIAGNOSIS — F341 Dysthymic disorder: Secondary | ICD-10-CM | POA: Insufficient documentation

## 2013-02-23 LAB — THYROXINE, FREE (FREE T4): THYROXINE, FREE (FREE T4): 0.86 ng/dL (ref 0.70–1.25)

## 2013-02-23 LAB — THYROID STIMULATING HORMONE (SENSITIVE TSH): TSH: 0.536 u[IU]/mL (ref 0.350–5.000)

## 2013-02-23 MED ORDER — ERGOCALCIFEROL (VITAMIN D2) 1,250 MCG (50,000 UNIT) CAPSULE
50000.00 [IU] | ORAL_CAPSULE | ORAL | Status: DC
Start: 2013-02-23 — End: 2014-01-17

## 2013-02-23 MED ORDER — ESCITALOPRAM 10 MG TABLET
10.0000 mg | ORAL_TABLET | Freq: Every day | ORAL | Status: DC
Start: 2013-02-23 — End: 2013-05-25

## 2013-02-23 NOTE — Progress Notes (Addendum)
Medical Group Practice  Operated by [facility]  Return Patient Note     Date:   02/23/2013  Name: Elizabeth Landry  Age: 38 y.o.    Chief Complaint: Hypothyroidism, Vitamin D Deficiency and Depression    Subjective:    38 yo female with PMH hypothyroidism, anxiety with depression, sickle cell trait, and vitamin D deficiency presents for f/u hypothyroidism, vitamin D deficiency, and anxiety/depression. Pt reports mood euthymic and much improved after being on Lexapro. Denies SI, excessive guilt, or concentration problems. Sleeping well. Taking medications without difficulty or side effects. Reports significant other still incarcerated. Recently started exercising at [employer]. Denies fevers, chills, cough, SOB, reflux, chest pain or weight changes. No other pertinent positive ROS.    Current Outpatient Prescriptions   Medication Sig    escitalopram oxalate (LEXAPRO) 10 mg Oral Tablet Take 1 Tab (10 mg total) by mouth Once a day    levothyroxine (SYNTHROID) 25 mcg Oral Tablet Take 1 Tab (25 mcg total) by mouth Every morning       Objective:  BP 102/60   Pulse 83   Temp(Src) 36.5 C (97.7 F) (Tympanic)   Ht 1.639 m (5' 4.53")   Wt 87.5 kg (192 lb 14.4 oz)   BMI 32.57 kg/m2   SpO2 99%  General- alert, pleasant, NAD, mildly obese  ENT- mucous membranes moist  Neck- trachea midline, no thyromegaly  Lungs- CTAB  Heart- RRR, no murmurs  Abdomen- nondistended  Extremities- no edema  Neuro- CN 2-12 grossly intact, no focal deficits, normal gait  Psych- mood euthymic, affect congruent    Data reviewed:    Lab Results   Component Value Date    TSH 0.536 02/23/2013    FREET4 0.86 02/23/2013     Hemogram   Lab Results   Component Value Date/Time    WBC 9.3 12/22/2012  9:58 AM    HGB 12.5 12/22/2012  9:58 AM    HCT 36.7 12/22/2012  9:58 AM    PLTCNT 328 12/22/2012  9:58 AM    RBC 4.82 12/22/2012  9:58 AM    MCV 76.1* 12/22/2012  9:58 AM    MCHC 34.2 12/22/2012  9:58 AM    MCH 26.0* 12/22/2012  9:58 AM    RDW 14.1  12/22/2012  9:58 AM    MPV 8.1 12/22/2012  9:58 AM        Lab Results   Component Value Date    SODIUM 142 12/22/2012    POTASSIUM 4.0 12/22/2012    CHLORIDE 106 12/22/2012    CO2 28 12/22/2012    ANIONGAP 8 12/22/2012    BUN 13 12/22/2012    CREATININE 0.80 12/22/2012    BUNCRRATIO 16 12/22/2012    GFR >59 12/22/2012    GLUCOSEFAST 93 12/22/2012       Lab Results   Component Value Date    TRIG 86 12/22/2012    HDLCHOL 45 12/22/2012    LDLCHOL 111* 12/22/2012    CHOLESTEROL 173 12/22/2012       Assessment and Plan  1. Vitamin D deficiency    2. hypothyroidism      38 yo female with PMH hypothyroidism, anxiety with depression, sickle cell trait, and vitamin D deficiency presents for f/u hypothyroidism, vitamin D deficiency, and anxiety/depression.    Hypothyroidism- suspected  - repeat TSH and Free T4 pending  - Anti-TPO antibodies pending as well  - if TPO antibodies negative, would consider d/c Synthroid and recheck TSH and Free  T4 in a few months  - continue synthyroid 25 mc po qam for now    Vitamin D deficiency  - 25 OH vitamin D 18 in Dec. 2014  - start cholecalciferol 50,000 IU weekly x 8 weeks  - check 25-OH vitamin D on 04/21/13 to assess response and will start reduced daily dose of likely 800 IU daily     Anxiety with depression   - mood euthymic  - refilled lexapro    Sickle cell trait   - hemoglobin normal at last check    Exposure to hepatitis C and intercourse with IVDU   - hep C and HIV screen negative previously    Polydipsia- resolved  - DM-2 screen negative previously    Healthcare maintenance   - tetantus received within 10 years   - received flu shot Oct. 2014   - receives gyn care from Dr. Jeanella CaraLatiffe with last pap normal in 2013 per pt   - lipids ok at last check Dec 2014  - pt not interested in contraception    RTC 6 months     Wilburt FinlayJason Willis Obbie Lewallen, MD    I discussed the patient's care with the Resident prior to the patient leaving the clinic. Any significant discussion points are noted .    Freddie ApleyKelley  M. Mikey CollegeGannon, MD    Assistant Professor  [facility] [employer]  Department of Internal Medicine

## 2013-02-24 LAB — THYROPEROXIDASE (TPO) ANTIBODIES, SERUM: THYROPEROXIDASE (TPO) ANTIBODIES, SERUM: <10 [IU]/mL (ref <21)

## 2013-03-03 ENCOUNTER — Telehealth (INDEPENDENT_AMBULATORY_CARE_PROVIDER_SITE_OTHER): Payer: Self-pay | Admitting: Family Medicine

## 2013-03-03 ENCOUNTER — Encounter (INDEPENDENT_AMBULATORY_CARE_PROVIDER_SITE_OTHER): Payer: Self-pay | Admitting: Family Medicine

## 2013-03-03 DIAGNOSIS — E079 Disorder of thyroid, unspecified: Secondary | ICD-10-CM

## 2013-03-03 NOTE — Telephone Encounter (Signed)
Called patient to discuss thyroid studies. No answer and voicemail not set up. Called work number and stated pt no longer works at [employer]. Will send letter. Recommend stop synthroid and recheck TSH, free T4 in 3 months as not clear if pt is hypothyroid. Order placed.    Wilburt FinlayJason Willis Addyson Traub, MD  03/03/2013, 13:02

## 2013-04-07 ENCOUNTER — Encounter (INDEPENDENT_AMBULATORY_CARE_PROVIDER_SITE_OTHER): Payer: Self-pay | Admitting: Family Medicine

## 2013-04-10 ENCOUNTER — Telehealth (HOSPITAL_COMMUNITY): Payer: Self-pay | Admitting: Family Medicine

## 2013-04-10 NOTE — Telephone Encounter (Signed)
Message copied by Wilburt FinlayLIKENS, Jinelle Butchko WILLIS on Mon Apr 10, 2013  5:13 PM  ------       Message from: Acie FredricksonAKOSI, CRYSTAL R       Created: Fri Apr 07, 2013  8:51 AM       Regarding: FW: Non-Urgent Medical Question       Contact: [phone]                       ----- Message -----          From: Baldwin Jamaicaulann J Bouch          Sent: 04/07/2013   8:37 AM            To: [facility] Nurses       Subject: Non-Urgent Medical Question                                Hey Dr. Noland FordyceLikens,              For the past week I've been having trouble sleeping.  My anxiety has been a little high also.  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  Do you think it would be ok to up my Lexapro to 20mg  a day or would you rather me come in and talk to you?  You can either respond on here or call me at [phone].              Thank you,              Glori  ------

## 2013-04-10 NOTE — Telephone Encounter (Signed)
Called pt to discuss anxiety and difficulty sleeping. Pt states this started 2 weeks ago. Asking to increase Lexapro. Pt denies thoughts of self harm. Reports little caffeine intake. Reading just prior to bed. Advised lifestyle modification for insomnia. Will increase Lexapro to 15 mg daily. Advised pt to make appointment for late May for f/u. Advised pt that risk of increasing risk for SI. Instructed pt to seek medical care ASAP if develops thoughts of self-harm. Pt agreeable. States does not need new prescription. Message from pt as below:    Elizabeth FinlayJason Landry Elizabeth Onofrio, MD 04/10/2013, 17:16  PGY-1  Department of Internal Medicine  [facility] [place]            Hey Dr. Noland FordyceLikens,        For the past week I've been having trouble sleeping. My anxiety has been a little high also. 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 Do you think it would be ok to up my Lexapro to 20mg  a day or would you rather me come in and talk to you? You can either respond on here or call me at [phone].        Thank you,        Elizabeth Landry

## 2013-04-21 ENCOUNTER — Ambulatory Visit
Admission: RE | Admit: 2013-04-21 | Discharge: 2013-04-21 | Disposition: A | Discharge status: Home / Self Care | Payer: MEDICAID | Source: Ambulatory Visit | Attending: Internal Medicine | Admitting: Internal Medicine

## 2013-04-21 DIAGNOSIS — E559 Vitamin D deficiency, unspecified: Secondary | ICD-10-CM | POA: Insufficient documentation

## 2013-04-25 LAB — VITAMIN D, SERUM (25 HYDROXYVITAMIN D2 AND D3 BY MS)
25 HYDROXYVITAMIN D2/D3,total: 50.6 ng/mL (ref <100)
25 HYDROXYVITAMIN D2: 38.8 ng/mL
25 HYDROXYVITAMIN D3: 11.8 ng/mL

## 2013-05-11 ENCOUNTER — Telehealth (INDEPENDENT_AMBULATORY_CARE_PROVIDER_SITE_OTHER): Payer: Self-pay | Admitting: Family Medicine

## 2013-05-11 MED ORDER — CHOLECALCIFEROL (VITAMIN D3) 10 MCG (400 UNIT) CAPSULE
2.0000 | ORAL_CAPSULE | Freq: Every day | ORAL | Status: DC
Start: 2013-05-11 — End: 2014-01-17

## 2013-05-11 NOTE — Telephone Encounter (Signed)
Called pt and advised will start cholecalciferol 800 IU po daily and recheck vitamin D level in a few months. Pt states will make appointment later this month to f/u depression but states she is doing well.    Wilburt FinlayJason Willis Rhydian Baldi, MD 05/11/2013, 13:29  PGY-1  Department of Internal Medicine  [facility] [place]

## 2013-05-25 ENCOUNTER — Ambulatory Visit: Payer: MEDICAID | Attending: Internal Medicine | Admitting: Family Medicine

## 2013-05-25 ENCOUNTER — Encounter (INDEPENDENT_AMBULATORY_CARE_PROVIDER_SITE_OTHER): Payer: Self-pay | Admitting: Family Medicine

## 2013-05-25 VITALS — BP 104/78 | HR 88 | Temp 97.0°F | Ht 64.0 in | Wt 185.0 lb

## 2013-05-25 DIAGNOSIS — E559 Vitamin D deficiency, unspecified: Secondary | ICD-10-CM | POA: Insufficient documentation

## 2013-05-25 DIAGNOSIS — F3289 Other specified depressive episodes: Secondary | ICD-10-CM | POA: Insufficient documentation

## 2013-05-25 DIAGNOSIS — F32A Depression, unspecified: Secondary | ICD-10-CM

## 2013-05-25 DIAGNOSIS — E039 Hypothyroidism, unspecified: Secondary | ICD-10-CM | POA: Insufficient documentation

## 2013-05-25 DIAGNOSIS — S82899A Other fracture of unspecified lower leg, initial encounter for closed fracture: Secondary | ICD-10-CM

## 2013-05-25 DIAGNOSIS — F329 Major depressive disorder, single episode, unspecified: Secondary | ICD-10-CM

## 2013-05-25 MED ORDER — ESCITALOPRAM 10 MG TABLET
15.0000 mg | ORAL_TABLET | Freq: Every day | ORAL | Status: DC
Start: 2013-05-25 — End: 2016-06-26

## 2013-05-25 NOTE — Progress Notes (Addendum)
Medical Group Practice  Oper[facility] by [facility]  Return Patient Note     Date:   05/25/2013  Name: Elizabeth Landry  Age: 38 y.o.    Chief Complaint: Depression    Subjective:    38 yo female with PMH vitamin D deficiency, depression and previous h/o thyroid disease presents for f/u depression. Pt has been taking Lexapro 15 mg daily since 04/10/13. Doing well with increased medication dose. Reports mood as happy. Denies sleep problems, anhedonia, excessive guilt, decreased/increased energy, concentration difficulty, or increased/decreased appetite. 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 Pt has been off synthroid and is to have repeat labs later this month. Taking vitamin D 800 IU since 05/11/13. No other pertinent positive ROS.    Current Outpatient Prescriptions   Medication Sig    Cholecalciferol, Vitamin D3, 400 unit Oral Capsule Take 2 Caps by mouth Once a day    ergocalciferol, vitamin D2, (DRISDOL) 50,000 unit Oral Capsule Take 1 Cap (50,000 Int'l Units total) by mouth Every 7 days    escitalopram oxalate (LEXAPRO) 10 mg Oral Tablet Take 1.5 Tabs (15 mg total) by mouth Once a day       Objective:  BP 104/78    Pulse 88    Temp(Src) 36.1 C (97 F) (Tympanic)    Ht 1.626 m (5\' 4" )    Wt 83.9 kg (184 lb 15.5 oz)    BMI 31.73 kg/m2      SpO2 98%     General- alert, NAD  Head- AT/NC  ENT- mucous membranes moist, dentition intact  Neck- no thyromegaly, trachea midline  Heart- RRR, no murmurs  Lungs- CTAB  Abdomen- nondistended, nontender  Neuro- CN 2-12 normal, patellar reflexes 1+ b/l, no focal deficits  Skin- no rashes or lesions  MSK- no obvious deformities, normal gait  Psych- mood euthymic, affect congruent, denies SI/HI/AVH    Data reviewed:    Lab Results   Component Value Date    TSH 0.536 02/23/2013    FREET4 0.86 02/23/2013       Assessment and Plan  1. hypothyroidism    2. Depression    3. Vitamin D deficiency      38 yo female with PMH vitamin D deficiency, depression and previous h/o thyroid  disease presents for f/u depression. Pt doing well with increased Lexapro dose.    Depression--- well controlled  - doing well on Lexapro 15 mg po daily since 04/10/13  - counseled pt to contact office if mood worsens or go to ED if has thoughts of self harm    H/o thyroid disease--- normal TSH, FT4  - off synthroid as do not believe pt has hypothyroidism  - repeat TSH and free T4 on 05/31/13    Vitamin D deficiency  - taking cholecalciferol 800 IU daily  - check 25 OH vitamin D on 05/31/13    RTC 08/24/13 to f/u depression     Wilburt FinlayJason Willis Likens, MD 05/25/2013, 09:13  PGY-1  Department of Internal Medicine  [facility] [place]      I discussed the patient's care with the Resident prior to the patient leaving the clinic. Any significant discussion points are noted .    Freddie ApleyKelley M. Mikey CollegeGannon, MD    Assistant Professor  [facility]   [employer]  Department of Internal Medicine

## 2013-06-12 ENCOUNTER — Ambulatory Visit
Admit: 2013-06-12 | Discharge: 2013-06-12 | Disposition: A | Discharge status: Home / Self Care | Payer: MEDICAID | Source: Ambulatory Visit | Attending: NURSE PRACTITIONER | Admitting: NURSE PRACTITIONER

## 2013-06-12 DIAGNOSIS — F411 Generalized anxiety disorder: Secondary | ICD-10-CM | POA: Insufficient documentation

## 2013-06-12 LAB — CBC/DIFF
BASOPHILS: 0 %
BASOS ABS: 0.036 THOU/uL (ref 0.000–0.200)
EOS ABS: 0.148 10*3/uL (ref 0.000–0.500)
EOSINOPHIL: 2 %
HCT: 38.1 % (ref 33.5–45.2)
HGB: 12.9 g/dL (ref 11.2–15.2)
LYMPHOCYTES: 28 %
LYMPHS ABS: 2.582 10*3/uL (ref 1.000–4.800)
MCH: 27.1 pg — ABNORMAL LOW (ref 27.4–33.0)
MCHC: 33.8 g/dL (ref 32.5–35.8)
MCV: 80.2 fL (ref 78–100)
MONOCYTES: 7 %
MONOS ABS: 0.662 THOU/uL (ref 0.300–1.000)
MPV: 10 fL (ref 7.5–11.5)
PLATELET COUNT: 313 THOU/uL (ref 140–450)
PMN ABS: 5.752 10*3/uL (ref 1.500–7.700)
PMN'S: 63 %
RBC: 4.76 MIL/uL (ref 3.63–4.92)
RDW: 13.9 % (ref 12.0–15.0)
WBC: 9.2 THOU/uL (ref 3.5–11.0)

## 2013-06-12 LAB — THYROID STIMULATING HORMONE (SENSITIVE TSH): TSH: 0.695 u[IU]/mL (ref 0.350–5.000)

## 2013-06-12 LAB — THYROXINE, FREE (FREE T4): THYROXINE, FREE (FREE T4): 0.82 ng/dL (ref 0.70–1.25)

## 2013-06-12 LAB — THYROXINE, TOTAL T4: THYROXINE, TOTAL T4: 4.5 ug/dL — ABNORMAL LOW (ref 5.0–12.0)

## 2013-06-13 LAB — HGA1C (HEMOGLOBIN A1C WITH EST AVG GLUCOSE)
ESTIMATED AVERAGE GLUCOSE: 105 mg/dL
HEMOGLOBIN A1C: 5.3 % (ref 4.0–6.0)

## 2013-06-14 LAB — VITAMIN D, SERUM (25 HYDROXYVITAMIN D2 AND D3 BY MS)
25 HYDROXYVITAMIN D2/D3,total: 33.8 ng/mL (ref <100)
25 HYDROXYVITAMIN D2: 18.5 ng/mL
25 HYDROXYVITAMIN D3: 15.3 ng/mL

## 2013-08-24 ENCOUNTER — Encounter (INDEPENDENT_AMBULATORY_CARE_PROVIDER_SITE_OTHER): Payer: 59 | Admitting: Family Medicine

## 2013-10-05 ENCOUNTER — Other Ambulatory Visit: Payer: Self-pay

## 2014-01-05 DIAGNOSIS — O009 Unspecified ectopic pregnancy without intrauterine pregnancy: Secondary | ICD-10-CM

## 2014-01-05 HISTORY — DX: Unspecified ectopic pregnancy without intrauterine pregnancy: O00.90

## 2014-01-05 HISTORY — PX: ECTOPIC PREGNANCY SURGERY: SHX613

## 2014-01-17 ENCOUNTER — Inpatient Hospital Stay (HOSPITAL_BASED_OUTPATIENT_CLINIC_OR_DEPARTMENT_OTHER): Payer: Self-pay | Admitting: General Practice

## 2014-01-17 ENCOUNTER — Other Ambulatory Visit (HOSPITAL_COMMUNITY): Payer: Self-pay | Admitting: Obstetrics & Gynecology

## 2014-01-17 ENCOUNTER — Encounter (HOSPITAL_COMMUNITY)
Admission: EM | Disposition: A | Discharge status: Home / Self Care | Payer: Self-pay | Source: Home / Self Care | Attending: Obstetrics & Gynecology

## 2014-01-17 ENCOUNTER — Inpatient Hospital Stay (HOSPITAL_COMMUNITY): Payer: Self-pay | Admitting: General Practice

## 2014-01-17 ENCOUNTER — Observation Stay (HOSPITAL_BASED_OUTPATIENT_CLINIC_OR_DEPARTMENT_OTHER): Payer: Self-pay | Admitting: Obstetrics & Gynecology

## 2014-01-17 ENCOUNTER — Observation Stay
Admission: EM | Admit: 2014-01-17 | Discharge: 2014-01-18 | Disposition: A | Discharge status: Home / Self Care | Payer: Medicaid Other | Attending: Obstetrics & Gynecology | Admitting: Obstetrics & Gynecology

## 2014-01-17 ENCOUNTER — Emergency Department (HOSPITAL_COMMUNITY): Payer: Self-pay

## 2014-01-17 ENCOUNTER — Encounter (HOSPITAL_COMMUNITY): Payer: Self-pay | Admitting: Obstetrics & Gynecology

## 2014-01-17 ENCOUNTER — Emergency Department (EMERGENCY_DEPARTMENT_HOSPITAL): Payer: Self-pay

## 2014-01-17 DIAGNOSIS — O001 Tubal pregnancy: Principal | ICD-10-CM | POA: Insufficient documentation

## 2014-01-17 DIAGNOSIS — Z882 Allergy status to sulfonamides status: Secondary | ICD-10-CM | POA: Insufficient documentation

## 2014-01-17 DIAGNOSIS — Z87891 Personal history of nicotine dependence: Secondary | ICD-10-CM | POA: Insufficient documentation

## 2014-01-17 DIAGNOSIS — O009 Unspecified ectopic pregnancy without intrauterine pregnancy: Secondary | ICD-10-CM

## 2014-01-17 DIAGNOSIS — B379 Candidiasis, unspecified: Secondary | ICD-10-CM | POA: Insufficient documentation

## 2014-01-17 DIAGNOSIS — D573 Sickle-cell trait: Secondary | ICD-10-CM | POA: Insufficient documentation

## 2014-01-17 DIAGNOSIS — K661 Hemoperitoneum: Secondary | ICD-10-CM | POA: Insufficient documentation

## 2014-01-17 DIAGNOSIS — Z01818 Encounter for other preprocedural examination: Secondary | ICD-10-CM

## 2014-01-17 DIAGNOSIS — F418 Other specified anxiety disorders: Secondary | ICD-10-CM | POA: Insufficient documentation

## 2014-01-17 DIAGNOSIS — E039 Hypothyroidism, unspecified: Secondary | ICD-10-CM | POA: Insufficient documentation

## 2014-01-17 DIAGNOSIS — N760 Acute vaginitis: Secondary | ICD-10-CM | POA: Insufficient documentation

## 2014-01-17 DIAGNOSIS — D62 Acute posthemorrhagic anemia: Secondary | ICD-10-CM | POA: Insufficient documentation

## 2014-01-17 DIAGNOSIS — I499 Cardiac arrhythmia, unspecified: Secondary | ICD-10-CM

## 2014-01-17 HISTORY — PX: HX SALPINGECTOMY: 2100001146

## 2014-01-17 HISTORY — DX: Unspecified asthma, uncomplicated: J45.909

## 2014-01-17 LAB — CBC/DIFF
BASOPHILS: 1 %
BASOS ABS: 0.058 10*3/uL (ref 0.000–0.200)
EOS ABS: 0.081 THOU/uL (ref 0.000–0.500)
EOSINOPHIL: 1 %
HCT: 33.8 % (ref 33.5–45.2)
HGB: 11.4 g/dL (ref 11.2–15.2)
LYMPHOCYTES: 21 %
LYMPHS ABS: 2.654 10*3/uL (ref 1.000–4.800)
MCH: 27 pg — ABNORMAL LOW (ref 27.4–33.0)
MCHC: 33.7 g/dL (ref 32.5–35.8)
MCV: 80 fL (ref 78–100)
MONOCYTES: 7 %
MONOS ABS: 0.881 THOU/uL (ref 0.300–1.000)
MPV: 9.2 fL (ref 7.5–11.5)
PLATELET COUNT: 262 10*3/uL (ref 140–450)
PMN ABS: 8.909 THOU/uL — ABNORMAL HIGH (ref 1.500–7.700)
PMN'S: 70 %
RBC: 4.22 MIL/uL (ref 3.63–4.92)
RDW: 13.3 % (ref 12.0–15.0)
WBC: 12.6 THOU/uL — ABNORMAL HIGH (ref 3.5–11.0)

## 2014-01-17 LAB — HCG, PLASMA OR SERUM QUANTITATIVE, PREGNANCY
HCG QUANTITATIVE/PREG: 21909 m[IU]/mL — ABNORMAL HIGH (ref <5)
HCG QUANTITATIVE/PREG: 20692 m[IU]/mL — ABNORMAL HIGH (ref <5)

## 2014-01-17 LAB — BASIC METABOLIC PANEL
ANION GAP: 8 mmol/L (ref 4–13)
BUN/CREAT RATIO: 12 (ref 6–22)
BUN: 10 mg/dL (ref 8–25)
CALCIUM: 9.5 mg/dL (ref 8.5–10.4)
CARBON DIOXIDE: 24 mmol/L (ref 22–32)
CHLORIDE: 107 mmol/L (ref 96–111)
CREATININE: 0.81 mg/dL (ref 0.49–1.10)
ESTIMATED GLOMERULAR FILTRATION RATE: >59 ml/min/1.73m2 (ref >59)
GLUCOSE,NONFAST: 88 mg/dL (ref 65–139)
POTASSIUM: 3.7 mmol/L (ref 3.5–5.1)
SODIUM: 139 mmol/L (ref 136–145)

## 2014-01-17 LAB — WETMOUNT

## 2014-01-17 LAB — URINALYSIS MACROSCOPIC WITH REFLEX TO MICROSCOPIC URINALYSIS (CULTURE NOT PERFORMED)
SPECIFIC GRAVITY, URINE: 1.019 (ref 1.005–1.030)
UROBILINOGEN: 4 mg/dL — AB

## 2014-01-17 LAB — ALT (SGPT): ALT (SGPT): 13 U/L (ref <55)

## 2014-01-17 LAB — PTT (PARTIAL THROMBOPLASTIN TIME): APTT: 33.7 s (ref 25.1–36.5)

## 2014-01-17 LAB — ECG 12-LEAD
Calculated T Axis: 10 degrees
PR Interval: 118 ms
Ventricular rate: 93 {beats}/min

## 2014-01-17 LAB — URINALYSIS WITH MICROSCOPIC REFLEX IF INDICATED
BILIRUBIN: NEGATIVE
GLUCOSE: NEGATIVE mg/dL
LEUKOCYTES: NEGATIVE
PROTEIN: NEGATIVE mg/dL

## 2014-01-17 LAB — URINALYSIS, MICROSCOPIC
RBC'S: 1 /HPF (ref <6)
WBC'S: 1 /HPF (ref <11)

## 2014-01-17 LAB — LIPASE: LIPASE: 10 U/L (ref 10–80)

## 2014-01-17 LAB — AST (SGOT): AST (SGOT): 11 U/L (ref 8–41)

## 2014-01-17 LAB — ALK PHOS (ALKALINE PHOSPHATASE): ALKALINE PHOSPHATASE: 74 U/L (ref <150)

## 2014-01-17 LAB — TYPE AND CROSS RED CELLS - UNITS
ABO/RH(D): A POS
ANTIBODY SCREEN: NEGATIVE
UNITS ORDERED: 2

## 2014-01-17 LAB — PT/INR: INR: 1.34 — ABNORMAL HIGH (ref 0.80–1.20)

## 2014-01-17 LAB — GAMMA GT: GAMMA GT: 25 U/L (ref 7–50)

## 2014-01-17 LAB — HCG, SERUM QUALITATIVE, PREGNANCY: PREGNANCY, SERUM QUALITATIVE: POSITIVE — AB

## 2014-01-17 LAB — BILIRUBIN, CONJUGATED (DIRECT): BILIRUBIN,CONJUGATED: 0.4 mg/dL — ABNORMAL HIGH (ref <0.3)

## 2014-01-17 SURGERY — SALPINGECTOMY LAPAROSCOPIC
Anesthesia: General | Site: Abdomen | Wound class: Clean Contaminated Wounds-The respiratory, GI, Genital, or urinary

## 2014-01-17 MED ORDER — ACETAMINOPHEN 1,000 MG/100 ML (10 MG/ML) INTRAVENOUS SOLUTION
Freq: Once | INTRAVENOUS | Status: DC | PRN
Start: 2014-01-17 — End: 2014-01-17
  Administered 2014-01-17: 1000 mg via INTRAVENOUS

## 2014-01-17 MED ORDER — BUPIVACAINE (PF) 0.25 % (2.5 MG/ML) INJECTION SOLUTION
20.00 mL | Freq: Once | INTRAMUSCULAR | Status: DC | PRN
Start: 2014-01-17 — End: 2014-01-17
  Administered 2014-01-17: 19 mL

## 2014-01-17 MED ORDER — ONDANSETRON HCL (PF) 4 MG/2 ML INJECTION SOLUTION
4.0000 mg | Freq: Four times a day (QID) | INTRAMUSCULAR | Status: DC | PRN
Start: 2014-01-17 — End: 2014-01-18

## 2014-01-17 MED ORDER — BUPIVACAINE (PF) 0.25 % (2.5 MG/ML) INJECTION SOLUTION
INTRAMUSCULAR | Status: AC
Start: 2014-01-17 — End: 2014-01-17
  Filled 2014-01-17: qty 30

## 2014-01-17 MED ORDER — PHENYLEPHRINE 0.5 MG/5 ML (100 MCG/ML)IN 0.9 % SOD.CHLORIDE IV SYRINGE
INJECTION | Freq: Once | INTRAVENOUS | Status: DC | PRN
Start: 2014-01-17 — End: 2014-01-17
  Administered 2014-01-17: 50 ug via INTRAVENOUS
  Administered 2014-01-17: 150 ug via INTRAVENOUS

## 2014-01-17 MED ORDER — IBUPROFEN 600 MG TABLET
600.0000 mg | ORAL_TABLET | Freq: Four times a day (QID) | ORAL | Status: DC | PRN
Start: 2014-01-17 — End: 2014-01-18
  Filled 2014-01-17 (×2): qty 1

## 2014-01-17 MED ORDER — DEXAMETHASONE SODIUM PHOSPHATE 4 MG/ML INJECTION SOLUTION
Freq: Once | INTRAMUSCULAR | Status: DC | PRN
Start: 2014-01-17 — End: 2014-01-17
  Administered 2014-01-17: 4 mg via INTRAVENOUS

## 2014-01-17 MED ORDER — EPHEDRINE SULFATE 50 MG/ML INJECTION SOLUTION
Freq: Once | INTRAMUSCULAR | Status: DC | PRN
Start: 2014-01-17 — End: 2014-01-17
  Administered 2014-01-17: 10 mg via INTRAVENOUS

## 2014-01-17 MED ORDER — HYDROMORPHONE 1 MG/ML INJECTION WRAPPER
0.20 mg | INJECTION | INTRAMUSCULAR | Status: DC | PRN
Start: 2014-01-17 — End: 2014-01-17

## 2014-01-17 MED ORDER — FENTANYL (PF) 50 MCG/ML INJECTION SOLUTION
Freq: Once | INTRAMUSCULAR | Status: DC | PRN
Start: 2014-01-17 — End: 2014-01-17
  Administered 2014-01-17: 50 ug via INTRAVENOUS
  Administered 2014-01-17 (×2): 25 ug via INTRAVENOUS
  Administered 2014-01-17 (×2): 100 ug via INTRAVENOUS

## 2014-01-17 MED ORDER — LIDOCAINE (PF) 100 MG/5 ML (2 %) INTRAVENOUS SYRINGE
INJECTION | Freq: Once | INTRAVENOUS | Status: DC | PRN
Start: 2014-01-17 — End: 2014-01-17
  Administered 2014-01-17: 80 mg via INTRAVENOUS

## 2014-01-17 MED ORDER — ONDANSETRON HCL (PF) 4 MG/2 ML INJECTION SOLUTION
4.00 mg | Freq: Once | INTRAMUSCULAR | Status: DC | PRN
Start: 2014-01-17 — End: 2014-01-17

## 2014-01-17 MED ORDER — HYDROMORPHONE 1 MG/ML INJECTION WRAPPER
0.40 mg | INJECTION | INTRAMUSCULAR | Status: DC | PRN
Start: 2014-01-17 — End: 2014-01-17

## 2014-01-17 MED ORDER — GLYCOPYRROLATE 0.2 MG/ML INJECTION SOLUTION
Freq: Once | INTRAMUSCULAR | Status: DC | PRN
Start: 2014-01-17 — End: 2014-01-17
  Administered 2014-01-17: 0.6 mg via INTRAVENOUS

## 2014-01-17 MED ORDER — PROPOFOL 10 MG/ML IV BOLUS
INJECTION | Freq: Once | INTRAVENOUS | Status: DC | PRN
Start: 2014-01-17 — End: 2014-01-17
  Administered 2014-01-17: 160 mg via INTRAVENOUS

## 2014-01-17 MED ORDER — SODIUM CHLORIDE 0.9 % INTRAVENOUS SOLUTION
INTRAVENOUS | Status: DC
Start: 2014-01-17 — End: 2014-01-17
  Administered 2014-01-17: 0 via INTRAVENOUS

## 2014-01-17 MED ORDER — LACTATED RINGERS INTRAVENOUS SOLUTION
INTRAVENOUS | Status: DC
Start: 2014-01-17 — End: 2014-01-17

## 2014-01-17 MED ORDER — MIDAZOLAM 1 MG/ML INJECTION SOLUTION
Freq: Once | INTRAMUSCULAR | Status: DC | PRN
Start: 2014-01-17 — End: 2014-01-17
  Administered 2014-01-17: 2 mg via INTRAVENOUS

## 2014-01-17 MED ORDER — DOCUSATE SODIUM 100 MG CAPSULE
100.0000 mg | ORAL_CAPSULE | Freq: Two times a day (BID) | ORAL | Status: DC
Start: 2014-01-17 — End: 2014-01-18
  Administered 2014-01-17: 100 mg via ORAL
  Filled 2014-01-17 (×3): qty 1

## 2014-01-17 MED ORDER — FENTANYL (PF) 50 MCG/ML INJECTION SOLUTION
25.00 ug | INTRAMUSCULAR | Status: DC | PRN
Start: 2014-01-17 — End: 2014-01-17

## 2014-01-17 MED ORDER — ROCURONIUM 50 MG/5 ML (10 MG/ML) INTRAVENOUS SYRINGE
INJECTION | Freq: Once | INTRAVENOUS | Status: DC | PRN
Start: 2014-01-17 — End: 2014-01-17
  Administered 2014-01-17: 30 mg via INTRAVENOUS

## 2014-01-17 MED ORDER — ONDANSETRON HCL (PF) 4 MG/2 ML INJECTION SOLUTION
Freq: Once | INTRAMUSCULAR | Status: DC | PRN
Start: 2014-01-17 — End: 2014-01-17
  Administered 2014-01-17: 4 mg via INTRAVENOUS

## 2014-01-17 MED ORDER — NEOSTIGMINE METHYLSULFATE 1 MG/ML INTRAVENOUS SOLUTION
Freq: Once | INTRAVENOUS | Status: DC | PRN
Start: 2014-01-17 — End: 2014-01-17
  Administered 2014-01-17: 3 mg via INTRAVENOUS

## 2014-01-17 MED ORDER — LACTATED RINGERS INTRAVENOUS SOLUTION
INTRAVENOUS | Status: DC | PRN
Start: 2014-01-17 — End: 2014-01-17

## 2014-01-17 MED ORDER — PROCHLORPERAZINE EDISYLATE 10 MG/2 ML (5 MG/ML) INJECTION SOLUTION
5.00 mg | Freq: Once | INTRAMUSCULAR | Status: DC | PRN
Start: 2014-01-17 — End: 2014-01-17

## 2014-01-17 MED ORDER — SUCCINYLCHOLINE CHLORIDE 200 MG/10 ML (20 MG/ML) INTRAVENOUS SYRINGE
INJECTION | Freq: Once | INTRAVENOUS | Status: DC | PRN
Start: 2014-01-17 — End: 2014-01-17
  Administered 2014-01-17: 200 mg via INTRAVENOUS

## 2014-01-17 MED ORDER — LACTATED RINGERS INTRAVENOUS SOLUTION
INTRAVENOUS | Status: DC
Start: 2014-01-18 — End: 2014-01-18

## 2014-01-17 MED ORDER — OXYCODONE-ACETAMINOPHEN 5 MG-325 MG TABLET
2.0000 | ORAL_TABLET | ORAL | Status: DC | PRN
Start: 2014-01-17 — End: 2014-01-18
  Administered 2014-01-17 – 2014-01-18 (×2): 2 via ORAL
  Filled 2014-01-17 (×2): qty 2

## 2014-01-17 MED ORDER — KETOROLAC 30 MG/ML (1 ML) INJECTION SOLUTION
Freq: Once | INTRAMUSCULAR | Status: DC | PRN
Start: 2014-01-17 — End: 2014-01-17
  Administered 2014-01-17: 30 mg via INTRAVENOUS

## 2014-01-17 MED ADMIN — multivitamin tablet: INTRAVENOUS | @ 20:00:00

## 2014-01-17 SURGICAL SUPPLY — 70 items
ADHESIVE TISSUE EXOFIN 1.0ML_PREMIERPRO EXOFIN (SEALANTS) ×1
APPL 70% ISPRP 2% CHG 26ML 13._2X13.2IN CHLRPRP PREP DEHP-FR (WOUND CARE/ENTEROSTOMAL SUPPLY) ×1
APPL 70% ISPRP 2% CHG 26ML CHLRPRP HI-LT ORNG PREP STRL LF  DISP CLR (WOUND CARE SUPPLY) ×1 IMPLANT
BLANKET 3M BAIR HUG ADLT UPR B ODY 74X24IN PLMR 2 INCS ADH (MISCELLANEOUS PT CARE ITEMS) ×2 IMPLANT
CATH URETH DOVER 16FR 16IN FL_2W REINF TIP BAL SIL NPOR 5ML (UROLOGICAL SUPPLIES) ×1
CATH URETH DOVER 16FR 16IN FOLEY 2W REINF TIP BAL SIL NPOR 5CC STRL LF (UROLOGICAL SUPPLIES) ×1
CONV USE 338641 - PACK SURG LAPSCP NONST DISP LF (CUSTOM TRAYS & PACK) ×1 IMPLANT
CONV USE ITEM 124344 - LEGGINGS SURG 48X31IN CUF PRXM SMS 6IN STRL LF  DISP (DRAPE/PACKS/SHEETS/OR TOWEL) ×1
CONV USE ITEM 136676 - DRAPE PCH DRAIN PORT 33.5X32X14IN UNDR BUTT PRXM LF  STRL DISP SURG SMS 46X33.5IN (CUSTOM TRAYS & PACK) ×1
CONV USE ITEM 156524 - ADHESIVE TISSUE EXOFIN 1.0ML_PREMIERPRO EXOFIN (SEALANTS) ×1 IMPLANT
CONV USE ITEM 322842 - PAD FLOOR SUCTION ABSORBENT_83630 (Suction) IMPLANT
DEVICE ENDOS ENDO PNUT 5MM 45CM SWAB COTTON DISP (ENDOSCOPIC SUPPLIES)
DEVICE ENDOS ENDO PNUT 5MM 45C_M SWAB COTTON DISP (INSTRUMENTS ENDOMECHANICAL)
DEVICE SUT ENDO CLOSE 10MM TROCAR ST SPRG LD BLUNT STY ENDOS LF (ENDOSCOPIC SUPPLIES) ×1 IMPLANT
DEVICE SUT ENDO CLOSE 10MM TRO_CAR ST SPRG LD BLNT STY ENDOS (INSTRUMENTS ENDOMECHANICAL) ×1
DEVICE SUT ENDO STCH 10MM 15IN PSHBTN GRIP HNDL ENDOS SFT TISS STRL LF  DISP (ENDOSCOPIC SUPPLIES) IMPLANT
DEVICE SUT ENDO STCH 10MM 15IN_PSHBTN GRIP HNDL ENDOS SFT (INSTRUMENTS ENDOMECHANICAL)
DISCONTINUED USE 16871 - CATH URETH DOVER 16FR 16IN FOLEY 2W REINF TIP BAL SIL NPOR 5CC STRL LF (UROLOGICAL SUPPLIES) ×1 IMPLANT
DONUT EXTREMITY CUSHIONING 31143137 (POSITIONING PRODUCTS) ×2 IMPLANT
DRAPE PCH DRAIN PORT 33.5X32X14IN UNDR BUTT PRXM LF  STRL DISP SURG SMS 46X33.5IN (CUSTOM TRAYS & PACK) ×1 IMPLANT
DRAPE PCH DRAIN PORT 33.5X32X1_4IN UNDR BUTT PRXM LF STRL (CUSTOM TRAYS & PACK) ×1
DUPE USE ITEM 319390 - SUTURE TRCLSN PLGC 910 2-0 CT1_TAPERPOINT COAT VICRYL+ 27IN (SUTURE/WOUND CLOSURE) IMPLANT
ENDOCATCH 10MM SPEC POUCH_173050G 6EA/BX (INSTRUMENTS ENDOMECHANICAL) ×1
GARMENT COMPRESS MED CALF CENTAURA NYL VASOGRAD LTWT BRTHBL SEQ FIL BLU 18- IN (ORTHOPEDICS (NOT IMPLANTS)) ×1 IMPLANT
GARMENT COMPRESS MED CALF CENT_AURA NYL VASOGRAD LTWT BRTHBL (ORTHOPEDICS (NOT IMPLANTS)) ×1
IRR SUCT STRKFL2 TIP STRL LF  DISP (IRR) ×1 IMPLANT
IRR SUCT STRKFL2 TIP STRL LF_DISP (IRR) ×1
KIT POSITIONER SLT 31151090 6EA/CS (SUPP) ×2 IMPLANT
KIT RM TURNOVER CLEANOP CSTM INFCT CONTROL (KITS & TRAYS (DISPOSABLE)) ×1
KIT RM TURNOVER CLEANOP CSTM I_NFCT CONTROL (KITS & TRAYS (DISPOSABLE)) ×1
KIT RM TURNOVER CLEANOP CUSTOM INFCT CONTROL (KITS & TRAYS (DISPOSABLE)) ×1 IMPLANT
LABEL E-Z STICK_STLEZP1 100EA/CS (LABELS/CHART SUPPLIES) ×1
LABEL MED EZ PEEL MRKR LF (LABELS/CHART SUPPLIES) ×1 IMPLANT
LEGGINGS SURG 43X29IN CUF PRXM_SMS STRL LF DISP (DRAPE/PACKS/SHEETS/OR TOWEL) ×1
LEGGINGS SURG 48X31IN CUF PRXM SMS 6IN STRL LF  DISP (DRAPE/PACKS/SHEETS/OR TOWEL) ×1 IMPLANT
MANIPULATR UTER 32MM VCARE + SM CERV CUP (ENDOSCOPIC SUPPLIES) IMPLANT
MANIPULATR UTER 34MM VCARE + MED CUP STRL LF  DISP (ENDOSCOPIC SUPPLIES) IMPLANT
MANIPULATR UTER 34MM VCARE + M_ED CUP STRL LF DISP (INSTRUMENTS ENDOMECHANICAL)
MANIPULATR UTER 37MM VCARE LRG CERV 2 CUP LOCK SCREW BAL HYST STRL LF  DISP (ENDOSCOPIC SUPPLIES) IMPLANT
MBO USE ITEM 130230 - DEVICE ENDOS ENDO PNUT 5MM 45CM SWAB COTTON DISP (ENDOSCOPIC SUPPLIES) IMPLANT
METER URN DRAIN BAG 350ML 2.5L STRL LF DISP (UROLOGICAL SUPPLIES) ×2 IMPLANT
NEEDLE INSFL 100MM 14GA STEP PNMPRTN TROCAR BLDLS RADIAL EXPD SLEEVE BLUNT STY VRSTP + SS (ENDOSCOPIC SUPPLIES) ×1 IMPLANT
NEEDLE INSFL 100MM 14GA STEP P_NMPRTN TROCAR BLDLS RADIAL (INSTRUMENTS ENDOMECHANICAL) ×1
PACK LAPAROSCOPIC CUSTOM (CUSTOM TRAYS & PACK) ×1
PAD ARMBOARD FOAM BLU_FP-ECARM (POSITIONING PRODUCTS) ×3
PAD ARMBRD BLU (POSITIONING PRODUCTS) ×3 IMPLANT
PAD FLOOR SUCTION ABSORBENT_83630 (Suction)
POUCH SPEC RETR 34.5CM 10MM E-CTCH GLD POLYUR ERG HNDL LONG CYL TUBE 29.5CM LAPSCP LF  DISP (ENDOSCOPIC SUPPLIES) ×1 IMPLANT
RELOAD SUT AST 2-0 ES-9 48IN 1 STCH ENDO STCH POLYSRB BRLN LACTOMER NYL STRL DISP VIOL (ENDOSCOPIC SUPPLIES) IMPLANT
RETRACTR/ELEV LAPSCP 32MM VCAR_E 2 CUP LOCK SCREW H (INSTRUMENTS ENDOMECHANICAL)
RETRACTR/ELEV LAPSCP 37MM VCAR_E 2 CUP H MANIPULATR INFL BAL (INSTRUMENTS ENDOMECHANICAL)
SEALDIVD LAPSCP 37CM 5MM LIGASURE 180D 17.8MM 2 ACT JAW BLUNT CONTOUR TIP NANO COAT 19.5MM STRL DISP (ENDOSCOPIC SUPPLIES) ×1 IMPLANT
SEALDIVD LAPSCP 37CM 5MM LIGAS_URE 180D 17.8MM 2 ACT JAW BLNT (INSTRUMENTS ENDOMECHANICAL) ×1
SEALER LIGASURE LAP 5MM X 20CM LS1520 6EA/BX (ENDOSCOPIC SUPPLIES) IMPLANT
SEALER LIGASURE LAP 5MM X 20CM LS1520 6EA/BX (INSTRUMENTS ENDOMECHANICAL)
SET 81IN REG CLAMP N-PYRG IRRG 10 GTT/ML STR CSCP DEHP BLADDER STRL LF (IV TUBING & ACCESSORIES) ×1 IMPLANT
SET 81IN REG CLAMP N-PYRG IRRG_10 GTT/ML STR CSCP DEHP (IV TUBING & ACCESSORIES) ×1
SUTURE 4-0 PS2 MONOCRYL + MTPS 18IN MONOF ANBCTRL ABS (SUTURE/WOUND CLOSURE) IMPLANT
SUTURE 4-0 PS2 MONOCRYL + MTPS_18IN UNDYED MONOF ABS (SUTURE/WOUND CLOSURE)
SUTURE POLYSORB 2.0 ENDOSTITCH_170053 12/BX (INSTRUMENTS ENDOMECHANICAL)
SUTURE TRCLSN PLGC 910 2-0 CT1_TAPERPOINT COAT VICRYL+ 27IN (SUTURE/WOUND CLOSURE)
SYRINGE HYPO 10CC LL 309604 100/BX (Syringes w/ o Needles) ×4 IMPLANT
TRAY SKIN SCRUB 8IN VNYL COTTON 6 WNG 6 SPONGE STICK 2 TIP APPL DRY STRL LF (KITS & TRAYS (DISPOSABLE)) ×1 IMPLANT
TRAY SURG PREP SCR CR ESTM (KITS & TRAYS (DISPOSABLE)) ×1
TROCAR 5MM W/EXPAND SLEEVE DIL_VS101005 3EA/BX (INSTRUMENTS ENDOMECHANICAL) ×2
TROCAR LAPSCP STD 100MM 12MM VERSAONE THREAD ANCH BLUNT TIP STRL LF  DISP CLR (ENDOSCOPIC SUPPLIES) ×1 IMPLANT
TROCAR LAPSCP STD 5MM VRSTP BLDLS RADIAL EXPD SLEEVE CANN DIL STRL LF  DISP (ENDOSCOPIC SUPPLIES) ×2 IMPLANT
TROCAR VERSAONE BLUNT 12MM_OB BPT12STS (INSTRUMENTS ENDOMECHANICAL) ×1
WATER STRL 2000ML PLASTIC CONTAINR UROMATIC LF (SOLUTIONS) ×1
WATER STRL 2000ML PLASTIC CONT_AINR UROMATIC LF (SOLUTIONS) ×1

## 2014-01-17 NOTE — OR Surgeon (Addendum)
[facility]                                       OPERATIVE NOTE    Patient Name: Elizabeth Landry,Elizabeth Landry  Hospital Number: [id number]  Date of Service: 01/17/2014   Date of Birth: 12/01/1975    All elements must be documented.    Pre-Operative Diagnosis: 39 yo G3P1021 with Ruptured Ectopic Pregnancy   Post-Operative Diagnosis: Same plus Right Ectopic Pregnancy  Procedure(s)/Description:  Operative Laparoscopy, Right Salpingectomy, Evacuation of Hemoperitoneum  Findings/Complexity (inherent to the procedure performed):   Normal appearing uterus  Right ruptured ectopic pregnancy  Hemoperitoneum (700 cc)  Normal appearing right ovary  Normal appearing left fallopian tube and ovary  Pelvic adhesions  [name] Syndrome     Attending Surgeon: Dennison MascotMeszaros, MD  Assistant(s): El-Amin DO (PGY-4), Katrinka BlazingSmith MD (PGY-2)    Anesthesia Type: General endotracheal anesthesia  Estimated Blood Loss:  Minimal  Blood Given: None  Fluids Given: Crystalloids per Anesthesia  Complications (unintended/unexpected/iatrogenic/accidental/inadvertent events):  None  Wound Class: Clean Contaminated Wounds -Respiratory, GI, Genital, or Urinary    Tubes: None  Drains: None  Specimens/ Cultures: Right fallopian tube and ectopic pregnancy  Implants: None           Disposition: PACU - hemodynamically stable.  Condition: stable    DESCRIPTION OF OPERATION:    A surgical pause was performed as per protocol. After adequate General anesthesia, the patient was placed in the lithotomy position and the vagina and perineum were prepped in the usual fashion. The bladder was drained. A sponge stick was placed in the vagina. The abdomen was then prepped and the patient draped in the usual fashion.    A curvilinear infraumbilical incision was made. The umbilical stalk was identified, grasped with Kocher clamps and followed down to its junction with the anterior abdominal wall fascia. The fascia was elevated and a small transverse incision was made.  This incision was extended by blunt dissection. A digit was passed through the fascial defect and the peritoneal cavity was entered. Stay sutures of 0-Vicryl were placed at each fascial angle using a G-U needle. The Hasson trochar was then introduced and attached to the previously placed stay sutures. The peritoneal cavity was then insufflated with carbon dioxide and the laparoscope was introduced to confirm an intraperitoneal location. The area beneath the trochar insertion site was inspected and there was no evidence of injury.    5 mm skin incisions in the right and left lower quadrants were made. When it was ascertained that there were no apparent intervening vessels or intraperitoneal structures adherent to this area, a 5 mm trochar was placed under direct visualization. A bowel grasper and suction irrigator were placed through the cannula. Hemoperitoneum was evacuated with suction. The ectopic was identified to be in the right fallopian tube. The right tube was resected using 5 mm Ligasure, good hemostasis was noted.    The pelvis was thoroughly irrigated. A 10 mm endocatch bag was used to remove ectopic using the umbilical incision.    The accessory trochars were removed under direct visualization and there was no evidence of bleeding. The laparoscope was removed and the CO2 allowed to escape from the peritoneal cavity. The Hasson trochar was removed and the previously placed 0-Vicryl stay sutures utilized to close the fascial incision. The umbilical incision and accessory trochar sites were closed with subcuticular 4-0  Monocryl and Dermabond.   All instrument were remove from vagina.   The patient appeared to tolerate the procedure well and left the operating room in stable condition. She was transferred to the PAR from whence she will be discharged to home when meets criteria.     Dr. Dennison Mascot was present and participated in entire procedure.    Rawan El-Amin, DO 01/17/2014, 20:11     Late entry for  01/17/14  I was present and supervised/observed the entire procedure.  Kizzie Bane, MD 01/18/2014, 13:12

## 2014-01-17 NOTE — ED Nurses Note (Signed)
OBgyn at bedside to speak to patient regarding surgery.

## 2014-01-17 NOTE — Anesthesia Transfer of Care (Cosign Needed)
ANESTHESIA TRANSFER OF CARE NOTE        Anesthesia Service      [facility]         Last Vitals: temp 37.0    VS per doc flowsheets  [facility]. Report given to RN.    1/13/2016at 20:15.

## 2014-01-17 NOTE — Nurses Notes (Signed)
Patient arrived to [facility] 3[facility] bed 28 at 2120. Oriented to room and call bell. VS and weight obtained. All required admission documentation completed with patient. No complaints of pain or discomfort at this time. Incisions to abdomen dermabonded, CDI. Resting comfortably in bed at this time.Call bell in reach. Will continue to monitor.

## 2014-01-17 NOTE — ED Nurses Note (Signed)
Pt. Here with c/o diffuse abd. Pain that started yesterday.  Denies fever, n/v/d.  Pt. Is alert and oriented.  Placed 20g LAC.  Labs and urine sent to lab.  Pt. Had pelvic exam.  Pt. Taken to ultrasound.

## 2014-01-17 NOTE — ED Provider Notes (Addendum)
Department of Emergency Medicine    Attending Physician: Dr. Santiago Bur    CC: Abdominal Pain    HPI:  Elizabeth Landry is a 39 y.o. female presenting to the ED via POV c/o abdominal pain.  Pt reports the development of a "gnawing" abdominal pain over the past day, intermittently radiating into her R shoulder.  Pain is described as constant, currently rating as an 8.  Additionally pt appreciates the development of an urticarial rash 2 days ago, seeking evaluation at [facility] and noting improvement w benadryl.  She denies any n/v/d, fevers, SOB, chest pain, palpitations, urinary changes, or vaginal discharge.  PMHx includes hypothyroidism.  Last depo shot 12/11/13 w pt appreciating light vaginal spotting yesterday afternoon.  LMP November 2015.  She is a former smoker.        Review of Systems:  Constitutional: No fever, chills or weakness   Skin: + Rash.   HENT: No headaches or congestion  Cardio: No chest pain, palpitations or leg swelling   Respiratory: No cough, wheezing or SOB  GI:  No nausea, vomiting or stool changes. + Abdominal pain.   GU:  No urinary changes. + Vaginal spotting.   MSK: No joint or back pain. + Radiating R shoulder pain.   Neuro: No seizures or LOC      History  PMH:    Past Medical History   Diagnosis Date    Depression with anxiety     Hypothyroidism, adult     Ectopic pregnancy      treated with methotrexate    Abnormal Pap smear 2006     normal after, reports colpo, follows with Dr. Kingsley Callander    Sickle cell trait          Previous Medications    CHOLECALCIFEROL, VITAMIN D3, 400 UNIT ORAL CAPSULE    Take 2 Caps by mouth Once a day    ERGOCALCIFEROL, VITAMIN D2, (DRISDOL) 50,000 UNIT ORAL CAPSULE    Take 1 Cap (50,000 Int'l Units total) by mouth Every 7 days    ESCITALOPRAM OXALATE (LEXAPRO) 10 MG ORAL TABLET    Take 1.5 Tabs (15 mg total) by mouth Once a day       PSH:    Past Surgical History   Procedure Laterality Date    Hx tonsil and adenoidectomy           Social Hx:    History        Social History    Marital Status: Single     Spouse Name: N/A     Number of Children: N/A    Years of Education: N/A     Occupational History     [employer].     pharmacy tech     Social History Main Topics    Smoking status: Former Smoker -- 1.00 packs/day for 10 years     Types: Cigarettes     Quit date: 12/23/2007    Smokeless tobacco: Not on file    Alcohol Use: 0.0 oz/week      Comment: occasional    Drug Use: No    Sexual Activity: Not on file     Other Topics Concern    Not on file     Social History Narrative    Lives with son.      Family Hx:   Family History   Problem Relation Age of Onset    Stroke Mother     High Cholesterol Mother  Diabetes Mother     Diabetes Father      Allergies:   Allergies   Allergen Reactions    Sulfa (Sulfonamides) Nausea/ Vomiting and Hives/ Urticaria       Above history reviewed with patient, changes are as documented.      Physical Exam:   Nursing notes reviewed    ED Triage Vitals   Enc Vitals Group      BP (Non-Invasive) 01/17/14 1459 134/84 mmHg      Heart Rate 01/17/14 1458 101      Respiratory Rate 01/17/14 1458 14      Temperature 01/17/14 1458 36.3 C (97.3 F)      Temp src --       SpO2-1 01/17/14 1458 100 %      Weight 01/17/14 1458 82.5 kg (181 lb 14.1 oz)      Height 01/17/14 1458 1.626 m (5' 4.02")       Constitutional: NAD. Oriented  Head: Normocephalic and atraumatic.    Eyes: conjugate gaze, PERRL   Neck: Trachea midline. Neck supple.  Cardiovascular: RRR, No murmurs, rubs or gallops. Intact distal pulses.  Pulmonary/Chest: BS equal bilaterally. No respiratory distress. No wheezes, rales or chest tenderness.   Abdominal: BS +. Bilateral lower quadrant tenderness, with voluntary guarding. No rebound. Negative Murphy's, Negative Rovsing, Negative Obturator and Psoas.   Pelvic: No external lesions. Bilateral tenderness on bimanual exam, worse on R. Cervical os closed. Tiny amount of dark red blood. No active bleeding. Exam Chaperoned.          Musculoskeletal: No edema, tenderness or deformity.  Skin: warm and dry. No rash, erythema, pallor or cyanosis. Mild urticarial rash over RUQ.   Neurological: Alert and oriented x3. Grossly intact.    Course and MDM:  MDM      Impression/Plan: Pt presenting to the ED concerning for but not limited to ectopic pregnancy vs ovarian cyst vs ovarian torsion vs PID. Patient was vitally stable throughout visit. Required no medical therapy for pain control.  Required no medical therapy for nausea control. Laboratory results remarkable for positive serum pregnancy.  Imaging showed ruptured ectopic of a live gestation, with free fluid. Results were discussed with patient. They were given an opportunity to ask questions.    Pt administered the following medications while in the ED.   Medications   NS premix infusion ( Intravenous Stopped 01/17/14 1700)        Orders for the following have been placed:    Orders Placed This Encounter    CANCELED: URINE CULTURE,ROUTINE    WETMOUNT(POC)    URINE CULTURE,ROUTINE    [place] FEMALE PELVIS    MOBILE CHEST X-RAY    CBC/DIFF    BASIC METABOLIC PANEL, NON-FASTING    LIPASE    ALK PHOS (ALKALINE PHOSPHATASE)    ALT (SGPT)    AST (SGOT)    BILIRUBIN, CONJUGATED (DIRECT)    GAMMA GT    URINALYSIS WITH CULTURE REFLEX    URINALYSIS, MICROSCOPIC    CANCELED: HCG, URINE QUALITATIVE, PREGNANCY    HCG, SERUM QUALITATIVE, PREGNANCY    NEISSERIA GONORRHOEAE DNA BY PCR    CHLAMYDIA / GU BY PCR    URINALYSIS WITH CULTURE REFLEX    URINALYSIS, MICROSCOPIC    BLOOD/SPECIMEN IN LAB    PT/INR    PTT (PARTIAL THROMBOPLASTIN TIME)    HCG, PLASMA OR SERUM QUANTITATIVE, PREGNANCY    HCG, PLASMA OR SERUM QUANTITATIVE, PREGNANCY    ECG 12-LEAD  TYPE AND CROSS RED CELLS    INSERT & MAINTAIN PERIPHERAL IV ACCESS    NS premix infusion       Labs Reviewed   WETMOUNT(POC) - Abnormal; Notable for the following:     WETMOUNT   (*)     Value: YEAST PRESENT  NO TRICHOMONAS SEEN  CLUE  CELLS PRESENT  THE WET PREP IS A PRELIMINARY RAPID TEST AND SHOULD BE CONFIRMED BY OTHER METHODS IF CLINICAL QUESTIONS EXIST      All other components within normal limits   CBC/DIFF - Abnormal; Notable for the following:     WBC 12.6 (*)     MCH 27.0 (*)     PMN ABS 8.909 (*)     All other components within normal limits   BILIRUBIN, CONJUGATED (DIRECT) - Abnormal; Notable for the following:     BILIRUBIN,CONJUGATED 0.4 (*)     All other components within normal limits   URINALYSIS WITH CULTURE REFLEX - Abnormal; Notable for the following:     APPEARANCE CLOUDY (*)     BLOOD LARGE (*)     UROBILINOGEN 4.0 (*)     All other components within normal limits   URINALYSIS, MICROSCOPIC - Abnormal; Notable for the following:     SQUAMOUS EPITHELIAL MANY (*)     All other components within normal limits   HCG, SERUM QUALITATIVE, PREGNANCY - Abnormal; Notable for the following:     PREGNANCY, SERUM QUALITATIVE POSITIVE (*)     All other components within normal limits   PT/INR - Abnormal; Notable for the following:     PROTHROMBIN TIME 14.7 (*)     INR 1.34 (*)     All other components within normal limits   URINE CULTURE,ROUTINE   BASIC METABOLIC PANEL, NON-FASTING   LIPASE   ALK PHOS (ALKALINE PHOSPHATASE)   ALT (SGPT)   AST (SGOT)   GAMMA GT   BLOOD/SPECIMEN IN LAB   PTT (PARTIAL THROMBOPLASTIN TIME)   NEISSERIA GONORRHOEAE DNA BY PCR   CHLAMYDIA TRACHOMITIS DNA BY PCR (INHOUSE)   URINALYSIS WITH CULTURE REFLEX   URINALYSIS, MICROSCOPIC   HCG, PLASMA OR SERUM QUANTITATIVE, PREGNANCY   HCG, PLASMA OR SERUM QUANTITATIVE, PREGNANCY   TYPE AND CROSS RED CELLS     All labs were reviewed. Medical Records reviewed.     Radiographical Imaging:     Results for orders placed or performed during the hospital encounter of 01/17/14 (from the past 72 hour(s))   [place] FEMALE PELVIS     Status: Abnormal (Preliminary result)    Narrative    Verdene Rio  Technique:  [place] FEMALE PELVIS TRANSVAGINAL WITH LIMITED TRANSABDOMINAL   performed  Jan 17, 2014  4:54 PM.    Clinical History: 39 y.o.female with Pelvic pain.    There is a live ectopic gestation in the right adnexa adjacent to the   right ovary with a heart rate of 100 beats per minute. Crown-rump length   of 1.12 cm suggests 7 weeks 2 days age. There is copious echogenic fluid   throughout the pelvic cul-de-sac suggesting ruptured ectopic pregnancy.   Echogenic debris in the pelvic cul-de-sac suggest clot retraction. Fluid   is seen in the anterior cul-de-sac as well as the hepatorenal recess.    The uterus measures 8.9 x 4.4 x 5.3 cm. Endometrial thickness is 1.8 cm.   The right ovary measures 2.8 x 2.5 x 2.1 cm, and the left ovary measures   3.7 x 1.7  cm.      Impression     Ruptured live ectopic pregnancy in the right adnexa adjacent   to the right ovary.    Critical finding was discussed by telephone with Dr. Santiago Bur by Dr.   Loletha Carrow at time of interpretation [01/17/2014 5:26:14 PM - Clearnce Sorrel A].   XR AP MOBILE CHEST     Status: None (In process)    Narrative    Clinical history: 39 year old woman with reason for exam listed as   preoperative chest x-ray.    Technique: AP portable chest January 17, 2014 at 5:10 PM using a single   image.    Comparison: None.    Findings: Heart size and pulmonary vascularity are normal.  The lungs are   clear.  No pneumothorax or pleural effusion is present.  Included bony   structures are normal.  The included upper abdomen is negative.      Impression     Portable chest x-ray findings are normal.       5:14 PM Pt discovered to have suffered an ectopic pregnancy. Spoke w OB who informed appropriate plan of action is to consult GYN.  GYN paged 2x prior to consult at this time. Preoperative labs/rads ordered at this time, including type and cross of 2 units and establishment of 2nd IV and re-check of vitals.   5:25 PM GYN evaluating pt in ED.   5:43 PM Pt transferred to OR.     Disposition: To the OR  Patient will be admitted to OR for further  evaluation and management.     Impression:   Encounter Diagnosis   Name Primary?    Ectopic pregnancy Yes       I am scribing for, and in the presence of, Kizzie Fantasia, [place] for services provided on 01/17/2014.  Eilleen Kempf, SCRIBE   I personally performed the services described in this documentation, as scribed in my presence, and it is both accurate and complete.   Kizzie Fantasia, PA-C  The co-signing faculty was physically present in the emergency department and available for consultation and did particpate in the care of this patient.  Kizzie Fantasia, PA-C  01/17/2014, 18:36

## 2014-01-17 NOTE — ED Nurses Note (Signed)
Patient consented for surgery, iv fluids hanging, awaiting OR.

## 2014-01-17 NOTE — Consults (Addendum)
[facility]  Gynecology ED Consult  History and Physical    Name: Elizabeth Landry  Date of Admission: 01/17/2014  Date of Birth:  1975/07/12  Date of Service: 01/17/2014     Information Obtained from: patient  HPI: Elizabeth Landry is a 39 y.o. [id number] female who presents to ED with new onset sharp LLQ pain. Gyn service consulted for concern of ruptured ectopic pregnancy. She reports the pain started last night, however increased some today. Sharp throbbing pain throughout abdomen, but worst in LLQ, does not radiate. Some nausea, but no vomiting. Reports no headache/fever/chills, SoB/CP/palpitations, V/D/C, dizziness/weakness.  Hx of uncomplicated SVD 1517, Ectopic treated with Methotrexate in 2008, and MAB 2011. Does not remember which side previous Ectopic was on. No abdominal surgical Hx, no Hx STD/PID. She was not previously on any form of birth control, but had a negative UPT when she received Depo (12/11/13). LMP 11/28/13.   Patient is in ED triage room by herself, father nearby. History taken is per patient and EMR.      OB/GYN HX:  OB History   Gravida Para Term Preterm AB SAB TAB Ectopic Multiple Living   3 1 1  2 1  1  1       # Outcome Date GA Lbr Len/2nd Weight Sex Delivery Anes PTL Lv   3 SAB 2011 [redacted]w[redacted]d   SAB         Comments: No meds, no complications   2 Ectopic 2008     ECTOPIC         Comments: Treated with Methotrexate   1 Term 1999    M Vag-Spont   Y           PAST MEDICAL HISTORY:   Past Medical History   Diagnosis Date    Depression with anxiety     Hypothyroidism, adult     Ectopic pregnancy      treated with methotrexate    Abnormal Pap smear 2006     normal after, reports colpo, follows with Dr. LKingsley Callander   Sickle cell trait        PAST SURGICAL HISTORY:   Past Surgical History   Procedure Laterality Date    Hx tonsil and adenoidectomy           CURRENT MEDICATIONS:   Current facility-administered medications: NS premix infusion, , Intravenous, Continuous, Denne,  NDoreene Eland MD, Stopped at 01/17/14 1700  Current outpatient prescriptions: Cholecalciferol, Vitamin D3, 400 unit Oral Capsule, Take 2 Caps by mouth Once a day, Disp: 60 Cap, Rfl: 6;  ergocalciferol, vitamin D2, (DRISDOL) 50,000 unit Oral Capsule, Take 1 Cap (50,000 Int'l Units total) by mouth Every 7 days, Disp: 8 Cap, Rfl: 0;  escitalopram oxalate (LEXAPRO) 10 mg Oral Tablet, Take 1.5 Tabs (15 mg total) by mouth Once a day, Disp: 135 Tab, Rfl: 2      ALLERGIES:   Allergies   Allergen Reactions    Sulfa (Sulfonamides) Nausea/ Vomiting and Hives/ Urticaria         SOCIAL HISTORY:   History     Social History    Marital Status: Single     Spouse Name: N/A     Number of Children: N/A    Years of Education: N/A     Occupational History     [employer]     pharmacy tech     Social History Main Topics    Smoking status: Former Smoker -- 1.00  packs/day for 10 years     Types: Cigarettes     Quit date: 12/23/2007    Smokeless tobacco: Not on file    Alcohol Use: 0.0 oz/week      Comment: occasional    Drug Use: No    Sexual Activity: Not on file     Other Topics Concern    Not on file     Social History Narrative    Lives with son.         FAMILY HISTORY:  Family History   Problem Relation Age of Onset    Stroke Mother     High Cholesterol Mother     Diabetes Mother     Diabetes Father          ROS:   ROS per HPI, otherwise considered negative.        PHYSICAL EXAMINATION:  BP 122/85 mmHg   Pulse 84   Temp(Src) 36.6 C (97.8 F)   Resp 14   Ht 1.626 m (5' 4.02")   Wt 82.5 kg (181 lb 14.1 oz)   BMI 31.20 kg/m2   SpO2 99%    Constitutional: Oriented, well-developed and well-nourished. No distress.   Head: Normocephalic and atraumatic  Eyes: Conjunctivae and extraocular motions are normal. No scleral icterus.   Neck: Normal range of motion.  Cardiovascular: RRR, +S1S2, no m/r/g   Pulm:  Effort normal, breath sounds normal.  Observations:  no respiratory distress. No w/r/r  Abdomen: BS+, but muffled.  No distension or masses. Soft, tender to palpation globally, worst in LLQ. No guarding but +rebound tenderness that refers to LLQ.   GU: Exam deferred.  Extremities: No edema or tenderness. Strong bilateral pedal pulses    Neurological: She is alert and oriented. CN 2-12 grossly intact  Skin: Skin is warm and dry. No rash noted. She is not diaphoretic.  Psychiatric: She has a normal mood and affect, with normal behavior. Judgment and thought content normal.       DATA REVIEWED:  Labs:    Lab Results for Last 24 Hours:    Results for orders placed or performed during the hospital encounter of 01/17/14 (from the past 24 hour(s))   CBC/DIFF   Result Value Ref Range    WBC 12.6 (H) 3.5 - 11.0 THOU/uL    RBC 4.22 3.63 - 4.92 MIL/uL    HGB 11.4 11.2 - 15.2 g/dL    HCT 33.8 33.5 - 45.2 %    MCV 80.0 78 - 100 fL    MCH 27.0 (L) 27.4 - 33.0 pg    MCHC 33.7 32.5 - 35.8 g/dL    RDW 13.3 12.0 - 15.0 %    PLATELET COUNT 262 140 - 450 THOU/uL    MPV 9.2 7.5 - 11.5 fL    PMN'S 70 %    PMN ABS 8.909 (H) 1.500 - 7.700 THOU/uL    LYMPHOCYTES 21 %    LYMPHS ABS 2.654 1.000 - 4.800 THOU/uL    MONOCYTES 7 %    MONOS ABS 0.881 0.300 - 1.000 THOU/uL    EOSINOPHIL 1 %    EOS ABS 0.081 0.000 - 0.500 THOU/uL    BASOPHILS 1 %    BASOS ABS 0.058 0.000 - 0.200 THOU/uL   BASIC METABOLIC PANEL, NON-FASTING   Result Value Ref Range    SODIUM 139 136 - 145 mmol/L    POTASSIUM 3.7 3.5 - 5.1 mmol/L    CHLORIDE 107 96 - 111 mmol/L  CARBON DIOXIDE 24 22 - 32 mmol/L    ANION GAP 8 4 - 13 mmol/L    CREATININE 0.81 0.49 - 1.10 mg/dL    ESTIMATED GLOMERULAR FILTRATION RATE >59 >59 ml/min/1.35m    GLUCOSE,NONFAST 88 65 - 139 mg/dL    BUN 10 8 - 25 mg/dL    BUN/CREAT RATIO 12 6 - 22    CALCIUM 9.5 8.5 - 10.4 mg/dL   LIPASE   Result Value Ref Range    LIPASE 10 10 - 80 U/L   ALK PHOS (ALKALINE PHOSPHATASE)   Result Value Ref Range    ALKALINE PHOSPHATASE 74 <150 U/L   ALT (SGPT)   Result Value Ref Range    ALT (SGPT) 13 <55 U/L   AST (SGOT)   Result Value  Ref Range    AST (SGOT) 11 8 - 41 U/L   BILIRUBIN, CONJUGATED (DIRECT)   Result Value Ref Range    BILIRUBIN,CONJUGATED 0.4 (H) <0.3 mg/dL   GAMMA GT   Result Value Ref Range    GAMMA GT 25 7 - 50 U/L   HCG, SERUM QUALITATIVE, PREGNANCY   Result Value Ref Range    PREGNANCY, SERUM QUALITATIVE POSITIVE (A) NEGATIVE   WETMOUNT(POC)   Result Value Ref Range    SPECIMEN DESCRIPTION VAGINAL SPECIMEN     SPECIAL REQUESTS NONE     WETMOUNT (A)      YEAST PRESENT  NO TRICHOMONAS SEEN  CLUE CELLS PRESENT  THE WET PREP IS A PRELIMINARY RAPID TEST AND SHOULD BE CONFIRMED BY OTHER METHODS IF CLINICAL QUESTIONS EXIST      REPORT STATUS 01/17/2014  FINAL      URINALYSIS WITH CULTURE REFLEX   Result Value Ref Range    APPEARANCE CLOUDY (A) CLEAR    COLOR NORMAL NORMAL    SPECIFIC GRAVITY, URINE 1.019 1.005 - 1.030    GLUCOSE NEGATIVE NEGATIVE mg/dL    BILIRUBIN NEGATIVE NEGATIVE    KETONES NEGATIVE NEGATIVE mg/dL    BLOOD LARGE (A) NEGATIVE    PH URINE 6.0 5.0 - 8.0    PROTEIN NEGATIVE NEGATIVE mg/dL    UROBILINOGEN 4.0 (A) NEGATIVE mg/dL    NITRITE NEGATIVE NEGATIVE    LEUKOCYTES NEGATIVE NEGATIVE   URINALYSIS, MICROSCOPIC   Result Value Ref Range    RBC'S 1 <6 /HPF    WBC'S 1 <11 /HPF    BACTERIA OCCASIONAL OR LESS OCL^OCCASIONAL OR LESS /hpf    SQUAMOUS EPITHELIAL MANY (A) OCCASIONAL OR LESS /LPF    MUCOUS LIGHT LIGHT /LPF   PT/INR   Result Value Ref Range    PROTHROMBIN TIME 14.7 (H) 8.7 - 13.2 Sec    INR 1.34 (H) 0.80 - 1.20   PTT (PARTIAL THROMBOPLASTIN TIME)   Result Value Ref Range    APTT 33.7 25.1 - 36.5 Sec     IMAGING:  TVUS 01/17/2014 -  There is a live ectopic gestation in the right adnexa adjacent to the right ovary with a heart rate of 100 beats per minute. Crown-rump length of 1.12 cm suggests 7 weeks 2 days age. There is copious echogenic fluid throughout the pelvic cul-de-sac suggesting ruptured ectopic pregnancy. Echogenic debris in the pelvic cul-de-sac suggest clot retraction. Fluid is seen in the anterior  cul-de-sac as well as the hepatorenal recess.  The uterus measures 8.9 x 4.4 x 5.3 cm. Endometrial thickness is 1.8 cm. The right ovary measures 2.8 x 2.5 x 2.1 cm, and the left ovary measures 3.7 x  1.7 cm.  IMPRESSION: Ruptured live ectopic pregnancy in the right adnexa adjacent to the right ovary.     ASSESSMENT/PLAN: 39 y.o. female [phone] with likely ruptured Ectopic pregnancy  1. Likely ruptured Ectopic pregnancy  1d Hx of sharp LLQ pain  PE notes positive peritoneal signs  TVUS: Live ruptured ectopic pregnancy, likely in Rt adnexa   Fluid seen in anterior cul-de-sac as well as hepatorenal recess.   Labs: WBC 12.6, HH 11.4/33.8   PT/INR 14.7/1.34, PTT 33.7   BMP WNL  Repeat CBC pending  Hx Ectopic 2008 (?Left tube)- treated with Methotrexate  - Agree with radiologic diagnosis  - VSS, CBC WNL. Repeat CBC pending  - Type and cross pending  - Due to likelihood of rupture and potential worsening of status, will proceed immediately to OR  - Patient consented and prepared for surgery    2. Sickle cell trait  No current meds    3. Subclinical hypothyroidism  Hx of Synthroid, however no current meds  Last labs 06/12/13: TSH 0.695, fT4 0.82, tT4 4.5  No current s/sx    Dispo: Likely rupture ectopic pregnancy, proceed to OR for surgical management. Patient consented and prepared. Type and cross and repeat CBC pending.    Thank you for this consultation to Obstetrics/Gynecology. As always, please do not hesitate to contact us for any further questions or concerns.      Dot Lanes, MD 01/17/2014 17:39  PGY-2  [facility]   Department of Obstetrics & Gynecology           Late entry for 01/17/14. I saw and examined the patient.  I reviewed the resident's note.  I agree with the findings and plan of care as documented in the resident's note.  Any exceptions/additions are edited/noted.    Lafonda Mosses, MD 01/18/2014, 07:15

## 2014-01-17 NOTE — Anesthesia Preprocedure Evaluation (Signed)
Physical Exam:     Airway       Mallampati: II    TM distance: >3 FB    Neck ROM: full  Mouth Opening: good.  No Facial hair  No Beard  No endotracheal tube present  No Tracheostomy present    Dental       Dentition intact             Pulmonary    Breath sounds clear to auscultation       Cardiovascular    Rhythm: regular  Rate: Normal       Other findings            Anesthesia Plan:  Planned anesthesia type: general  ASA 2 - emergent     Intravenous induction   Patient's NPO status is appropriate for Anesthesia.    Anesthetic plan and risks discussed with patient.    Anesthesia issues/risks discussed are: Blood Loss, Stroke, Aspiration and Cardiac Events/MI.    Use of blood products discussed with patient whom.     Plan discussed with CRNA.

## 2014-01-17 NOTE — Anesthesia Postprocedure Evaluation (Signed)
ANESTHESIA POSTOP EVALUATION NOTE        [facility]est[facility]      [facility]     01/17/2014     Last Vitals: Temperature: 36.7 C (98.1 F) (01/17/14 2123)  Heart Rate: 85 (01/17/14 2123)  BP (Non-Invasive): 111/71 mmHg (01/17/14 2123)  Respiratory Rate: 18 (01/17/14 2123)  SpO2-1: 98 % (01/17/14 2123)  Pain Score (Numeric, Faces): 0 (01/17/14 2123)    Procedure(s):  SALPINGECTOMY LAPAROSCOPIC    Patient is sufficiently recovered from the effects of anesthesia to participate in the evaluation and has returned to their pre-procedure level.  I have reviewed and evaluated the following:  Respiratory Function: Consistent with pre anesthetic level  Cardiovascular Function: Consistent with pre anesthetic level  Mental Status: Return to pre anesthetic baseline level  Pain: Sufficiently controlled with medication  Nausea and Vomiting: Absent or sufficiently controlled with medication  Post-op Anesthetic Complications: None    Comment/ re-evaluation for any variations: None

## 2014-01-17 NOTE — ED Attending Note (Signed)
I personally saw and examined the patient.  See mid-level's note for additional details.  My findings are the patient is a 39 y.o. female with abdominal pain.  Patient received a Depo shot on 12-11-2013.  Further historical details can be found in the mid-level provider's note.    PE:  Alert and oriented x 3.  HEENT: AT/NC. PERRL. EOMI.  Extrems: Brisk capillary refill.  No deformities.  Skin: Warm, pink, dry.    Diagnostic tests reviewed:  Results up to the Time the Disposition was Entered   CBC/DIFF - Abnormal; Notable for the following:     WBC 12.6 (*)     MCH 27.0 (*)     PMN ABS 8.909 (*)     All other components within normal limits   BILIRUBIN, CONJUGATED (DIRECT) - Abnormal; Notable for the following:     BILIRUBIN,CONJUGATED 0.4 (*)     All other components within normal limits   URINALYSIS WITH CULTURE REFLEX - Abnormal; Notable for the following:     APPEARANCE CLOUDY (*)     BLOOD LARGE (*)     UROBILINOGEN 4.0 (*)     All other components within normal limits   URINALYSIS, MICROSCOPIC - Abnormal; Notable for the following:     SQUAMOUS EPITHELIAL MANY (*)     All other components within normal limits   WETMOUNT(POC) - Abnormal; Notable for the following:     WETMOUNT   (*)     Value: YEAST PRESENT  NO TRICHOMONAS SEEN  CLUE CELLS PRESENT  THE WET PREP IS A PRELIMINARY RAPID TEST AND SHOULD BE CONFIRMED BY OTHER METHODS IF CLINICAL QUESTIONS EXIST      All other components within normal limits   BASIC METABOLIC PANEL, NON-FASTING   LIPASE   ALK PHOS (ALKALINE PHOSPHATASE)   ALT (SGPT)   AST (SGOT)   GAMMA GT   BLOOD/SPECIMEN IN LAB   INSERT & MAINTAIN PERIPHERAL IV ACCESS   DIET NPO - NOW   URINE CULTURE,ROUTINE   HCG, SERUM QUALITATIVE, PREGNANCY   NEISSERIA GONORRHOEAE DNA BY PCR   CHLAMYDIA TRACHOMITIS DNA BY PCR (INHOUSE)   URINALYSIS WITH CULTURE REFLEX   URINALYSIS, MICROSCOPIC   URINE CULTURE,ROUTINE   [place] FEMALE PELVIS   NS premix infusion ( Intravenous Stopped 01/17/14 1700)       Results for  orders placed or performed during the hospital encounter of 01/17/14 (from the past 12 hour(s))   CBC/DIFF   Result Value Ref Range    WBC 12.6 (H) 3.5 - 11.0 THOU/uL    RBC 4.22 3.63 - 4.92 MIL/uL    HGB 11.4 11.2 - 15.2 g/dL    HCT 33.8 33.5 - 45.2 %    MCV 80.0 78 - 100 fL    MCH 27.0 (L) 27.4 - 33.0 pg    MCHC 33.7 32.5 - 35.8 g/dL    RDW 13.3 12.0 - 15.0 %    PLATELET COUNT 262 140 - 450 THOU/uL    MPV 9.2 7.5 - 11.5 fL    PMN'S 70 %    PMN ABS 8.909 (H) 1.500 - 7.700 THOU/uL    LYMPHOCYTES 21 %    LYMPHS ABS 2.654 1.000 - 4.800 THOU/uL    MONOCYTES 7 %    MONOS ABS 0.881 0.300 - 1.000 THOU/uL    EOSINOPHIL 1 %    EOS ABS 0.081 0.000 - 0.500 THOU/uL    BASOPHILS 1 %    BASOS ABS 0.058 0.000 -  0.200 THOU/uL   BASIC METABOLIC PANEL, NON-FASTING   Result Value Ref Range    SODIUM 139 136 - 145 mmol/L    POTASSIUM 3.7 3.5 - 5.1 mmol/L    CHLORIDE 107 96 - 111 mmol/L    CARBON DIOXIDE 24 22 - 32 mmol/L    ANION GAP 8 4 - 13 mmol/L    CREATININE 0.81 0.49 - 1.10 mg/dL    ESTIMATED GLOMERULAR FILTRATION RATE >59 >59 ml/min/1.35m    GLUCOSE,NONFAST 88 65 - 139 mg/dL    BUN 10 8 - 25 mg/dL    BUN/CREAT RATIO 12 6 - 22    CALCIUM 9.5 8.5 - 10.4 mg/dL   LIPASE   Result Value Ref Range    LIPASE 10 10 - 80 U/L   ALK PHOS (ALKALINE PHOSPHATASE)   Result Value Ref Range    ALKALINE PHOSPHATASE 74 <150 U/L   ALT (SGPT)   Result Value Ref Range    ALT (SGPT) 13 <55 U/L   AST (SGOT)   Result Value Ref Range    AST (SGOT) 11 8 - 41 U/L   BILIRUBIN, CONJUGATED (DIRECT)   Result Value Ref Range    BILIRUBIN,CONJUGATED 0.4 (H) <0.3 mg/dL   GAMMA GT   Result Value Ref Range    GAMMA GT 25 7 - 50 U/L   WETMOUNT(POC)   Result Value Ref Range    SPECIMEN DESCRIPTION VAGINAL SPECIMEN     SPECIAL REQUESTS NONE     WETMOUNT (A)      YEAST PRESENT  NO TRICHOMONAS SEEN  CLUE CELLS PRESENT  THE WET PREP IS A PRELIMINARY RAPID TEST AND SHOULD BE CONFIRMED BY OTHER METHODS IF CLINICAL QUESTIONS EXIST      REPORT STATUS 01/17/2014  FINAL         URINALYSIS WITH CULTURE REFLEX   Result Value Ref Range    APPEARANCE CLOUDY (A) CLEAR    COLOR NORMAL NORMAL    SPECIFIC GRAVITY, URINE 1.019 1.005 - 1.030    GLUCOSE NEGATIVE NEGATIVE mg/dL    BILIRUBIN NEGATIVE NEGATIVE    KETONES NEGATIVE NEGATIVE mg/dL    BLOOD LARGE (A) NEGATIVE    PH URINE 6.0 5.0 - 8.0    PROTEIN NEGATIVE NEGATIVE mg/dL    UROBILINOGEN 4.0 (A) NEGATIVE mg/dL    NITRITE NEGATIVE NEGATIVE    LEUKOCYTES NEGATIVE NEGATIVE   URINALYSIS, MICROSCOPIC   Result Value Ref Range    RBC'S 1 <6 /HPF    WBC'S 1 <11 /HPF    BACTERIA OCCASIONAL OR LESS OCL^OCCASIONAL OR LESS /hpf    SQUAMOUS EPITHELIAL MANY (A) OCCASIONAL OR LESS /LPF    MUCOUS LIGHT LIGHT /LPF       EKG:  Normal sinus rhythm without evidence of acute changes.    Results for orders placed or performed during the hospital encounter of 01/17/14 (from the past 72 hour(s))   [place] PELVIS     Status: Abnormal (Preliminary result)    Narrative    TVerdene Rio Technique:  [place] PELVIS TRANSVAGINAL WITH LIMITED TRANSABDOMINAL   performed Jan 17, 2014  4:54 PM.    Clinical History: 39y.o.female with Pelvic pain.    There is a live ectopic gestation in the right adnexa adjacent to the   right ovary with a heart rate of 100 beats per minute. Crown-rump length   of 1.12 cm suggests 7 weeks 2 days age. There is copious echogenic fluid   throughout  the pelvic cul-de-sac suggesting ruptured ectopic pregnancy.   Echogenic debris in the pelvic cul-de-sac suggest clot retraction. Fluid   is seen in the anterior cul-de-sac as well as the hepatorenal recess.    The uterus measures 8.9 x 4.4 x 5.3 cm. Endometrial thickness is 1.8 cm.   The right ovary measures 2.8 x 2.5 x 2.1 cm, and the left ovary measures   3.7 x 1.7 cm.      Impression     Ruptured live ectopic pregnancy in the right adnexa adjacent   to the right ovary.    Critical finding was discussed by telephone with Dr. Santiago Bur by Dr.   Loletha Carrow at time of interpretation [01/17/2014  5:26:14 PM - Clearnce Sorrel A].   XR AP MOBILE CHEST     Status: None (In process)    Narrative    Clinical history: 39 year old woman with reason for exam listed as   preoperative chest x-ray.    Technique: AP portable chest January 17, 2014 at 5:10 PM using a single   image.    Comparison: None.    Findings: Heart size and pulmonary vascularity are normal.  The lungs are   clear.  No pneumothorax or pleural effusion is present.  Included bony   structures are normal.  The included upper abdomen is negative.      Impression     Portable chest x-ray findings are normal.       MDM:  T&C performed.  IV x 2 established.  Seen and evaluated by the gynecology service.    I have reviewed the assessment and management plans and agree with the medical evaluation/clinical care of the patient.      Impression:  Ectopic pregnancy.    Plan:  Admit to the gynecology service.    Total critical care time spent in direct care of this patient at high risk of hemodynamic compromise based on presenting history/exam/and complaint, including initial evaluation and stabilization, review of data, re-examination, discussion with admitting and consulting services to arrange definitive care, and exclusive of any procedures performed, was 42 minutes.

## 2014-01-18 ENCOUNTER — Ambulatory Visit (HOSPITAL_COMMUNITY): Payer: Self-pay | Admitting: Obstetrics & Gynecology

## 2014-01-18 ENCOUNTER — Telehealth (HOSPITAL_COMMUNITY): Payer: Self-pay | Admitting: Obstetrics & Gynecology

## 2014-01-18 ENCOUNTER — Encounter (HOSPITAL_COMMUNITY): Payer: Self-pay | Admitting: Obstetrics & Gynecology

## 2014-01-18 LAB — CBC
HCT: 29.1 % — ABNORMAL LOW (ref 33.5–45.2)
HGB: 9.8 g/dL — ABNORMAL LOW (ref 11.2–15.2)
HGB: 9.8 g/dL — ABNORMAL LOW (ref 11.2–15.2)
MCH: 27.1 pg — ABNORMAL LOW (ref 27.4–33.0)
MCHC: 33.6 g/dL (ref 32.5–35.8)
MCV: 80.8 fL (ref 78–100)
MPV: 9.2 fL (ref 7.5–11.5)
PLATELET COUNT: 213 10*3/uL (ref 140–450)
RBC: 3.6 MIL/uL — ABNORMAL LOW (ref 3.63–4.92)
RDW: 13.6 % (ref 12.0–15.0)
WBC: 10 THOU/uL (ref 3.5–11.0)

## 2014-01-18 LAB — CHLAMYDIA TRACHOMITIS DNA BY PCR (INHOUSE): CHLAMYDIA TRACHOMATIS: NEGATIVE

## 2014-01-18 LAB — NEISSERIA GONORRHOEAE DNA BY PCR

## 2014-01-18 MED ORDER — IBUPROFEN 600 MG TABLET
600.00 mg | ORAL_TABLET | Freq: Four times a day (QID) | ORAL | Status: AC | PRN
Start: 2014-01-18 — End: ?

## 2014-01-18 MED ORDER — DIPHENHYDRAMINE 25 MG CAPSULE
50.0000 mg | ORAL_CAPSULE | Freq: Once | ORAL | Status: AC
Start: 2014-01-18 — End: 2014-01-18
  Administered 2014-01-18: 50 mg via ORAL
  Filled 2014-01-18: qty 2

## 2014-01-18 MED ORDER — FERROUS SULFATE 324 MG (65 MG IRON) TABLET,DELAYED RELEASE
324.00 mg | DELAYED_RELEASE_TABLET | Freq: Two times a day (BID) | ORAL | Status: DC
Start: 2014-01-18 — End: 2014-05-21

## 2014-01-18 MED ORDER — ASCORBIC ACID (VITAMIN C) 250 MG TABLET
250.00 mg | ORAL_TABLET | Freq: Two times a day (BID) | ORAL | Status: DC
Start: 2014-01-18 — End: 2014-05-21

## 2014-01-18 MED ORDER — NORETHINDRONE ACETATE 1 MG-ETHINYL ESTRADIOL 20 MCG TABLET
1.0000 | ORAL_TABLET | Freq: Every day | ORAL | Status: DC
Start: 2014-01-18 — End: 2016-06-26

## 2014-01-18 MED ORDER — FLUCONAZOLE 150 MG TABLET
150.0000 mg | ORAL_TABLET | Freq: Once | ORAL | Status: AC
Start: 2014-01-18 — End: 2014-01-18

## 2014-01-18 MED ORDER — IBUPROFEN 600 MG TABLET
600.00 mg | ORAL_TABLET | Freq: Four times a day (QID) | ORAL | Status: DC | PRN
Start: 2014-01-18 — End: 2014-01-18

## 2014-01-18 MED ORDER — METRONIDAZOLE 500 MG TABLET
500.00 mg | ORAL_TABLET | Freq: Two times a day (BID) | ORAL | Status: AC
Start: 2014-01-18 — End: 2014-01-25

## 2014-01-18 MED ORDER — OXYCODONE-ACETAMINOPHEN 5 MG-325 MG TABLET
2.00 | ORAL_TABLET | ORAL | Status: DC | PRN
Start: 2014-01-18 — End: 2014-05-21

## 2014-01-18 NOTE — Progress Notes (Addendum)
[facility]  Gynecology   Progress Note    Elizabeth Landry, Elizabeth Landry, 39 y.o. female  Date of Admission:  01/17/2014  Date of Service: 01/18/2014  Date of Birth:  [birth year]    Hospital Day:  LOS: 1 day     Subjective: Patient doing well, she reports pain is much improved. She denies any N/V. She denies any urinary symptoms.     Vital Signs:  Temp (24hrs) Max:37.1 C (03.1 F)      Systolic (59YVO), PFY:924 mmHg, Min:96 mmHg, MQK:863 mmHg    Diastolic (81RRN), HAF:79 mmHg, Min:58 mmHg, Max:85 mmHg    Temp  Avg: 36.8 C (98.3 F)  Min: 36.3 C (97.3 F)  Max: 37.1 C (98.8 F)  Pulse  Avg: 91.8  Min: 83  Max: 109  Resp  Avg: 16.4  Min: 14  Max: 18  SpO2  Avg: 99.1 %  Min: 98 %  Max: 100 %  MAP (Non-Invasive)  Avg: 81.8 mmHG  Min: 72 mmHG  Max: 87 mmHG  Pain Score (Numeric, Faces): 5    Current Medications:    Current Facility-Administered Medications:  docusate sodium (COLACE) capsule 100 mg Oral 2x/day   ibuprofen (MOTRIN) tablet 600 mg Oral Q6H PRN   LR premix infusion  Intravenous Continuous   ondansetron (ZOFRAN) 2 mg/mL injection 4 mg Intravenous Q6H PRN   oxyCODONE-acetaminophen (PERCOCET) 5-373m per tablet 2 Tab Oral Q4H PRN       Today's Physical Exam:  Temperature: 37.1 C (98.8 F)  Heart Rate: (!) 109  BP (Non-Invasive): 121/76 mmHg  Respiratory Rate: 18  SpO2-1: 98 %  Pain Score (Numeric, Faces): 5     General: NAD  Heart: RRR  Lungs: CTAB  Abdomen: Soft, minimal tenderness, trochar incisions CDI, Dermabond in place  Extremities: No erythema, edema or tenderness      I/O:  I/O last 24 hours:    Intake/Output Summary (Last 24 hours) at 01/18/14 00383 Last data filed at 01/18/14 0900   Gross per 24 hour   Intake   1140 ml   Output   1947 ml   Net   -807 ml     I/O current shift:  01/14 0800 - 01/14 1559  In: 240 [P.O.:240]  Out: -     Prophylaxis:  Date Started Date Completed   DVT/PE  SCDs/ Venodynes       Nutrition/Residuals:  DIET REGULAR    Labs  (Please indicate ordered or reviewed)  Reviewed:   Lab  Results for Last 24 Hours:    Results for orders placed or performed during the hospital encounter of 01/17/14 (from the past 24 hour(s))   CBC/DIFF   Result Value Ref Range    WBC 12.6 (H) 3.5 - 11.0 THOU/uL    RBC 4.22 3.63 - 4.92 MIL/uL    HGB 11.4 11.2 - 15.2 g/dL    HCT 33.8 33.5 - 45.2 %    MCV 80.0 78 - 100 fL    MCH 27.0 (L) 27.4 - 33.0 pg    MCHC 33.7 32.5 - 35.8 g/dL    RDW 13.3 12.0 - 15.0 %    PLATELET COUNT 262 140 - 450 THOU/uL    MPV 9.2 7.5 - 11.5 fL    PMN'S 70 %    PMN ABS 8.909 (H) 1.500 - 7.700 THOU/uL    LYMPHOCYTES 21 %    LYMPHS ABS 2.654 1.000 - 4.800 THOU/uL    MONOCYTES 7 %  MONOS ABS 0.881 0.300 - 1.000 THOU/uL    EOSINOPHIL 1 %    EOS ABS 0.081 0.000 - 0.500 THOU/uL    BASOPHILS 1 %    BASOS ABS 0.058 0.000 - 0.200 THOU/uL   BASIC METABOLIC PANEL, NON-FASTING   Result Value Ref Range    SODIUM 139 136 - 145 mmol/L    POTASSIUM 3.7 3.5 - 5.1 mmol/L    CHLORIDE 107 96 - 111 mmol/L    CARBON DIOXIDE 24 22 - 32 mmol/L    ANION GAP 8 4 - 13 mmol/L    CREATININE 0.81 0.49 - 1.10 mg/dL    ESTIMATED GLOMERULAR FILTRATION RATE >59 >59 ml/min/1.63m    GLUCOSE,NONFAST 88 65 - 139 mg/dL    BUN 10 8 - 25 mg/dL    BUN/CREAT RATIO 12 6 - 22    CALCIUM 9.5 8.5 - 10.4 mg/dL   LIPASE   Result Value Ref Range    LIPASE 10 10 - 80 U/L   ALK PHOS (ALKALINE PHOSPHATASE)   Result Value Ref Range    ALKALINE PHOSPHATASE 74 <150 U/L   ALT (SGPT)   Result Value Ref Range    ALT (SGPT) 13 <55 U/L   AST (SGOT)   Result Value Ref Range    AST (SGOT) 11 8 - 41 U/L   BILIRUBIN, CONJUGATED (DIRECT)   Result Value Ref Range    BILIRUBIN,CONJUGATED 0.4 (H) <0.3 mg/dL   GAMMA GT   Result Value Ref Range    GAMMA GT 25 7 - 50 U/L   HCG, PLASMA OR SERUM QUANTITATIVE, PREGNANCY   Result Value Ref Range    HCG QUANTITATIVE/PREG 21909 (H) <5 mIU/mL   HCG, SERUM QUALITATIVE, PREGNANCY   Result Value Ref Range    PREGNANCY, SERUM QUALITATIVE POSITIVE (A) NEGATIVE   WETMOUNT(POC)   Result Value Ref Range    SPECIMEN DESCRIPTION  VAGINAL SPECIMEN     SPECIAL REQUESTS NONE     WETMOUNT (A)      YEAST PRESENT  NO TRICHOMONAS SEEN  CLUE CELLS PRESENT  THE WET PREP IS A PRELIMINARY RAPID TEST AND SHOULD BE CONFIRMED BY OTHER METHODS IF CLINICAL QUESTIONS EXIST      REPORT STATUS 01/17/2014  FINAL      HCG, PLASMA OR SERUM QUANTITATIVE, PREGNANCY   Result Value Ref Range    HCG QUANTITATIVE/PREG 20692 (H) <5 mIU/mL   URINALYSIS WITH CULTURE REFLEX   Result Value Ref Range    APPEARANCE CLOUDY (A) CLEAR    COLOR NORMAL NORMAL    SPECIFIC GRAVITY, URINE 1.019 1.005 - 1.030    GLUCOSE NEGATIVE NEGATIVE mg/dL    BILIRUBIN NEGATIVE NEGATIVE    KETONES NEGATIVE NEGATIVE mg/dL    BLOOD LARGE (A) NEGATIVE    PH URINE 6.0 5.0 - 8.0    PROTEIN NEGATIVE NEGATIVE mg/dL    UROBILINOGEN 4.0 (A) NEGATIVE mg/dL    NITRITE NEGATIVE NEGATIVE    LEUKOCYTES NEGATIVE NEGATIVE   URINALYSIS, MICROSCOPIC   Result Value Ref Range    RBC'S 1 <6 /HPF    WBC'S 1 <11 /HPF    BACTERIA OCCASIONAL OR LESS OCL^OCCASIONAL OR LESS /hpf    SQUAMOUS EPITHELIAL MANY (A) OCCASIONAL OR LESS /LPF    MUCOUS LIGHT LIGHT /LPF   TYPE AND CROSS RED CELLS   Result Value Ref Range    UNITS ORDERED 2     SPECIMEN EXPIRATION DATE 01/20/2014     ABO/RH(D) A POSITIVE  ANTIBODY SCREEN NEGATIVE    PT/INR   Result Value Ref Range    PROTHROMBIN TIME 14.7 (H) 8.7 - 13.2 Sec    INR 1.34 (H) 0.80 - 1.20   PTT (PARTIAL THROMBOPLASTIN TIME)   Result Value Ref Range    APTT 33.7 25.1 - 36.5 Sec   ECG 12-LEAD   Result Value Ref Range    Ventricular rate 93 BPM    Atrial Rate 93 BPM    PR Interval 118 ms    QRS Duration 82 ms    QT Interval 354 ms    QTC Calculation 440 ms    Calculated P Axis 42 degrees    Calculated R Axis 25 degrees    Calculated T Axis 10 degrees   CBC   Result Value Ref Range    WBC 10.0 3.5 - 11.0 THOU/uL    RBC 3.60 (L) 3.63 - 4.92 MIL/uL    HGB 9.8 (L) 11.2 - 15.2 g/dL    HCT 29.1 (L) 33.5 - 45.2 %    MCV 80.8 78 - 100 fL    MCH 27.1 (L) 27.4 - 33.0 pg    MCHC 33.6 32.5 - 35.8  g/dL    RDW 13.6 12.0 - 15.0 %    PLATELET COUNT 213 140 - 450 THOU/uL    MPV 9.2 7.5 - 11.5 fL         Assessment/ Plan: 39 yo [id number]     POD#1 from Laparoscopic Right Salpingectomy    Hemodynamically stable   CBC stable this AM   Discussed OCPs with patient, will start   Will discharge home today in stable condition   Follow up in 2 weeks   Home with Motrin and Percocet    Acute Blood Loss Anemia   Start Iron and Vitamin C    BV   Flagyl 500 mg BID x 7 days    Yeast   Diflucan 150 mg x1      Rawan El-Amin, DO 01/18/2014, 09:27    I saw and examined the patient.  I reviewed the resident's note.  I agree with the findings and plan of care as documented in the resident's note.  Any exceptions/additions are edited/noted.    Lafonda Mosses, MD 01/18/2014, 13:01

## 2014-01-18 NOTE — Nurses Notes (Signed)
Patient left ambulatory per request. Patient is leaving in stable condition.

## 2014-01-18 NOTE — Care Management Notes (Signed)
Pt was d/c home with no needs prior to the initial assessment being completed.  Chelle Cayton, MSW  [phone]

## 2014-01-18 NOTE — Telephone Encounter (Signed)
-----   Message from Holly BodilyShirley Baker sent at 01/18/2014 11:04 AM EST -----  >> Holly BodilySHIRLEY BAKER 01/18/2014 11:04 AM  Dr Katrinka BlazingSmith     The patient stated she just missed your call about her test results and prescriptions.   Please advise the patient at [phone].   Thank you    Preferred Pharmacy     [facility] [place] - [address]    [address] [facility] [place] [id number]    Phone: [phone] Fax: [phone]    Open 24 Hours?: No

## 2014-01-18 NOTE — Discharge Summary (Addendum)
DISCHARGE SUMMARY      PATIENT NAME:  Elizabeth Landry,Elizabeth Landry  MRN:  [id number]  [name], Ator, 1977    ADMISSION DATE:  01/17/2014  DISCHARGE DATE:  01/18/2014    ATTENDING PHYSICIAN: Maron Stanzione, MD  PRIMARY CARE PHYSICIAN: Wilburt Finlay, MD     DISCHARGE DIAGNOSIS:   Principle Problem: <principal problem not specified>  Active Hospital Problems    Diagnosis Date Noted    Ruptured ectopic pregnancy 01/17/2014      Resolved Hospital Problems    Diagnosis    No resolved problems to display.     Active Non-Hospital Problems    Diagnosis Date Noted    Vitamin D deficiency 02/23/2013    Hypothyroidism 12/22/2012    Anxiety associated with depression 12/22/2012    Polydipsia 12/22/2012      Allergies   Allergen Reactions    Sulfa (Sulfonamides) Nausea/ Vomiting and Hives/ Urticaria     DISCHARGE MEDICATIONS:     Current Discharge Medication List      START taking these medications.       Details    ascorbic acid 250 mg Tablet   Commonly known as:  VITAMIN C    250 mg, Oral, 2 TIMES DAILY   Qty:  60 Tab   Refills:  2       ferrous sulfate 324 mg (65 mg iron) Tablet, Delayed Release (E.C.)   Commonly known as:  FERATAB    324 mg, Oral, 2 TIMES DAILY   Qty:  60 Tab   Refills:  2       Ibuprofen 600 mg Tablet   Commonly known as:  MOTRIN    600 mg, Oral, EVERY 6 HOURS PRN   Qty:  60 Tab   Refills:  1       Norethindrone Ac-Eth Estradiol 1-20 mg-mcg Tablet   Commonly known as:  equiv to: LOESTRIN 1-20    1 Tab, Oral, DAILY   Qty:  28 Tab   Refills:  11       oxyCODONE-acetaminophen 5-325 mg Tablet   Commonly known as:  PERCOCET    2 Tabs, Oral, EVERY 4 HOURS PRN   Qty:  20 Tab   Refills:  0         CONTINUE these medications which have CHANGED during your visit.       Details    escitalopram oxalate 10 mg Tablet   Commonly known as:  LEXAPRO   What changed:  how much to take    15 mg, Oral, DAILY   Qty:  135 Tab   Refills:  2         CONTINUE these medications - NO CHANGES were made during your visit.       Details    buPROPion 75  mg Tablet   Commonly known as:  WELLBUTRIN    75 mg, Oral, 2 TIMES DAILY   Refills:  0           DISCHARGE INSTRUCTIONS:  Follow-up Information     Follow up with Ob[facility]Gyn Clinic, [facility] .    Specialty:  Ob-Gyn    Contact information:    [address]  [place] [address]  [phone]    Additional information:    Below are driving directions to the [facility] (Ob/Gyn Clinic), located within the [facility], [place], [place]. If you need any additional information, please call [phone] or you may visit our website  at [email or web address].*From [address]:*Merge onto [address]:**From [address]:Take exit [address], [address]/[place]. If traveling [place], keep right off exit ramp; if traveling [place], turn left at the end of the exit ramp. At the 1st stop light, turn right onto [address]. Continue on [address] for 1.2 miles and the [facility] [place] [address] [place] building will be on your right, across from [employer].*Effective January 05, 2014, valet parking will no longer be available at our [address] [place] location.          DISCHARGE INSTRUCTION - MISC   Call with fever greater than 100.45F, severe abdominal pain, nausea, vomiting, pain not controlled with pain medications or foul smelling vaginal discharge. Call [phone] if you have any questions and ask for the GYN resident on call.     RETURN TO WORK/SCHOOL   Patient had surgery on 01/17/14, please excuse from work from 1/13-/14.   Patient May Return to Work: 01/22/2014    Activity Limitations: No heavy lifting greater that 10 pounds for 3 weeks    [facility]      SCHEDULE FOLLOW-UP [facility] - [facility]   Follow-up in: 2 WEEKS    Reason for visit: POST-OP VISIT    Followup reason: Post Op    Provider: El-Amin or Smith                REASON FOR HOSPITALIZATION AND HOSPITAL COURSE:  This  is a 39 y.o., female [phone] who presented to the ED for abdominal pain. She was diagnosed with a ruptured ectopic pregnancy with hemoperitoneum. She underwent a laparoscopic right salpingectomy, hemoperitoneum 700 cc. Patient did well intraoperatively, she was admitted overnight for monitoring. She was discharged home today in stable condition with Motrin and Percocet for pain control. Patient was prescribed OCPs for contraception, Flagyl for BV and Diflucan for Yeast.   She is to follow up in clinic in 2 weeks.  Strict discharge instructions were reviewed with patient.       CONDITION ON DISCHARGE:  A. Ambulation: Full ambulation  B. Self-care Ability: Complete  C. Cognitive Status Alert and Oriented x 3  D. DNR status at discharge: Full Code    DISCHARGE DISPOSITION:  Home discharge                Rawan El-Amin, DO        Copies sent to Care Team       Relationship Specialty Notifications Start End    Wilburt FinlayLikens, Jason Willis, MD PCP - General GENERAL  12/22/12     Phone: [phone] Fax: [phone]         [address] [facility] [place] [id number]          Referring providers can utilize [email or web address] to access their referred [facility] patient's information.

## 2014-01-18 NOTE — Nurses Notes (Signed)
Patient complaining of itching and requested benadryl. Service paged and notified. Awaiting response. Will continue to monitor.

## 2014-01-18 NOTE — Nurses Notes (Signed)
Reviewed discharge instructions with patient. Patient verbalized understanding to all discharge instructions. Reviewed medications, follow up appointments, and infection signs/symptoms/prevention. Patient denies questions/concerns. Patient currently waiting for breakfast. Contacted doctor regarding return to work slip.

## 2014-01-18 NOTE — Care Plan (Signed)
Problem: Perioperative Period (Adult)  Prevent and manage potential problems including:1. bleeding2. gastrointestinal complications3. hypothermia4. infection5. pain6. perioperative injury7. respiratory compromise8. situational response9. urinary retention10. venous thromboembolism11. wound complications   Goal: Signs and Symptoms of Listed Potential Problems Will be Absent or Manageable (Perioperative Period)  Signs and symptoms of listed potential problems will be absent or manageable by discharge/transition of care (reference Perioperative Period (Adult) CPG).   Outcome: Ongoing (see interventions/notes)    Problem: General Plan of Care(Adult,OB)  Goal: Individualization/Patient Specific Goal(Adult/OB)  Outcome: Ongoing (see interventions/notes)  Patient has had minimal complaints of pain. Has been resting comfortably throughout the night. Dermabond to abdominal incisions remains intact. LR running at 100/hr. For possible discharge today. Currently resting comfortably with eyes closed, respirations WDL. Will continue to monitor.  Goal: Plan of Care Review(Adult,OB)  The patient and/or their representative will communicate an understanding of their plan of care  Outcome: Ongoing (see interventions/notes)

## 2014-01-18 NOTE — Telephone Encounter (Signed)
01/18/2014 10:48   Elizabeth Landry   [id number]     TELEPHONE ENCOUNTER    39 y.o. female [phone]. Called and left voicemail on number listed. Explained wetmount came back positive, but didn't want to disclose over unknown voicemail. Explained she would need two prescriptions and could pick those up here, as she has no other pharmacy. May call back and ask for Gyn resident with any questions or a different pharmacy.   Prescriptions written for Flagyl 500 mg BID x7d and Diflucan 150mg  x1 (with second dose 3 days later as needed).      End of telephone encounter.    Elvina SidleHunter A Nahima Ales, MD 01/18/2014 10:48  PGY-2  [facility] [place]   Department of Obstetrics & Gynecology

## 2014-01-18 NOTE — Nurses Notes (Signed)
Medicated with 2 tabs percocet for complaints of abdominal pain. Also medicated with one time dose of benadryl for complaints of itching. Resting in bed. Call bell in reach. Will continue to monitor.

## 2014-01-18 NOTE — Nurses Notes (Signed)
Patient has had relief from pain after receiving percocet. Ambulating with no difficulty. Resting in bed at this time. No complaints. Call bell in reach. Will continue to monitor.

## 2014-01-18 NOTE — Telephone Encounter (Signed)
01/18/2014 11:18   Elizabeth Landry   [id number]     TELEPHONE ENCOUNTER  Reached patient on telephone number listed. Explained +yeast and +BV (clue cells and symptoms), and prescriptions of Diflucan and Flagyl (see previous note). Patient [facility] pharmacy.   Pt understands and agrees with plan.    End of telephone encounter.    Elvina SidleHunter A Marianita Botkin, MD 01/18/2014 11:18  PGY-2  [facility] [place]   Department of Obstetrics & Gynecology

## 2014-01-18 NOTE — Addendum Note (Signed)
Addendum  created 01/18/14 0007 by Lenor DerrickBelenkiy, Marlane Hirschmann M, MD    Modules edited: Anesthesia Medication Administration

## 2014-01-19 LAB — HISTORICAL SURGICAL PATHOLOGY SPECIMEN

## 2014-01-27 ENCOUNTER — Encounter (HOSPITAL_COMMUNITY): Payer: Self-pay

## 2014-01-27 ENCOUNTER — Emergency Department
Admission: EM | Admit: 2014-01-27 | Discharge: 2014-01-27 | Disposition: A | Discharge status: Home / Self Care | Payer: 59 | Attending: Emergency Medicine | Admitting: Emergency Medicine

## 2014-01-27 ENCOUNTER — Emergency Department (HOSPITAL_COMMUNITY): Payer: 59

## 2014-01-27 DIAGNOSIS — D72829 Elevated white blood cell count, unspecified: Secondary | ICD-10-CM | POA: Insufficient documentation

## 2014-01-27 DIAGNOSIS — M25512 Pain in left shoulder: Secondary | ICD-10-CM | POA: Insufficient documentation

## 2014-01-27 DIAGNOSIS — M791 Myalgia: Secondary | ICD-10-CM

## 2014-01-27 DIAGNOSIS — F17210 Nicotine dependence, cigarettes, uncomplicated: Secondary | ICD-10-CM | POA: Insufficient documentation

## 2014-01-27 DIAGNOSIS — Z9851 Tubal ligation status: Secondary | ICD-10-CM | POA: Insufficient documentation

## 2014-01-27 DIAGNOSIS — E039 Hypothyroidism, unspecified: Secondary | ICD-10-CM | POA: Insufficient documentation

## 2014-01-27 DIAGNOSIS — M2550 Pain in unspecified joint: Secondary | ICD-10-CM

## 2014-01-27 DIAGNOSIS — D573 Sickle-cell trait: Secondary | ICD-10-CM | POA: Insufficient documentation

## 2014-01-27 LAB — BASIC METABOLIC PANEL
ANION GAP: 9 mmol/L (ref 4–13)
BUN/CREAT RATIO: 16 (ref 6–22)
BUN: 14 mg/dL (ref 8–25)
CALCIUM: 9.5 mg/dL (ref 8.5–10.4)
CARBON DIOXIDE: 22 mmol/L (ref 22–32)
CHLORIDE: 108 mmol/L (ref 96–111)
CREATININE: 0.9 mg/dL (ref 0.49–1.10)
ESTIMATED GLOMERULAR FILTRATION RATE: >59 mL/min/{1.73_m2} (ref >59)
GLUCOSE,NONFAST: 96 mg/dL (ref 65–139)
POTASSIUM: 3.6 mmol/L (ref 3.5–5.1)
SODIUM: 139 mmol/L (ref 136–145)

## 2014-01-27 LAB — CREATINE KINASE (CK), TOTAL, SERUM: CREATINE KINASE (CK): 92 U/L (ref 25–190)

## 2014-01-27 LAB — CBC/DIFF
BASOPHILS: 0 %
BASOS ABS: 0.057 THOU/uL (ref 0.000–0.200)
EOS ABS: 0.186 THOU/uL (ref 0.000–0.500)
EOSINOPHIL: 1 %
HCT: 33.6 % (ref 33.5–45.2)
HGB: 11 g/dL — ABNORMAL LOW (ref 11.2–15.2)
LYMPHOCYTES: 14 %
LYMPHS ABS: 2.207 THOU/uL (ref 1.000–4.800)
MCH: 26.6 pg — ABNORMAL LOW (ref 27.4–33.0)
MCHC: 32.7 g/dL (ref 32.5–35.8)
MCV: 81.2 fL (ref 78–100)
MONOCYTES: 6 %
MONOS ABS: 0.956 THOU/uL (ref 0.300–1.000)
MPV: 8.9 fL (ref 7.5–11.5)
PLATELET COUNT: 337 10*3/uL (ref 140–450)
PMN ABS: 12.164 THOU/uL — ABNORMAL HIGH (ref 1.500–7.700)
PMN'S: 79 %
RBC: 4.14 MIL/uL (ref 3.63–4.92)
RDW: 13.8 % (ref 12.0–15.0)
RDW: 13.8 % (ref 12.0–15.0)
WBC: 15.6 THOU/uL — ABNORMAL HIGH (ref 3.5–11.0)

## 2014-01-27 LAB — B-TYPE NATRIURETIC PEPTIDE: B-TYPE NATRIURETIC PEPTIDE: 14 pg/mL (ref <100)

## 2014-01-27 LAB — HCG, SERUM QUALITATIVE, PREGNANCY: PREGNANCY, SERUM QUALITATIVE: POSITIVE — AB

## 2014-01-27 LAB — THYROID STIMULATING HORMONE WITH FREE T4 REFLEX: THYROID STIMULATING HORMONE WITH FREE T4 REFLEX: 1.139 u[IU]/mL (ref 0.350–5.000)

## 2014-01-27 LAB — C-REACTIVE PROTEIN(CRP),INFLAMMATION: C-REACTIVE PROTEIN (CRP),INFLAMMATION: 45.6 mg/L — ABNORMAL HIGH (ref <8.0)

## 2014-01-27 LAB — HCG, PLASMA OR SERUM QUANTITATIVE, PREGNANCY: HCG QUANTITATIVE/PREG: 85 m[IU]/mL — ABNORMAL HIGH (ref <5)

## 2014-01-27 MED ORDER — KETOROLAC 30 MG/ML (1 ML) INJECTION SOLUTION
30.0000 mg | INTRAMUSCULAR | Status: AC
Start: 2014-01-27 — End: 2014-01-27
  Administered 2014-01-27: 30 mg via INTRAVENOUS
  Filled 2014-01-27: qty 1

## 2014-01-27 MED ORDER — SODIUM CHLORIDE 0.9 % IV BOLUS
500.0000 mL | INJECTION | Status: AC
Start: 2014-01-27 — End: 2014-01-27
  Administered 2014-01-27: 500 mL via INTRAVENOUS

## 2014-01-27 MED ORDER — OXYCODONE-ACETAMINOPHEN 5 MG-325 MG TABLET
1.00 | ORAL_TABLET | ORAL | Status: DC | PRN
Start: 2014-01-27 — End: 2014-05-21

## 2014-01-27 NOTE — ED Provider Notes (Signed)
[facility]    CC:  Arthralgia    HPI:  Elizabeth Landry is a 39 y.o. female with history of hypothyroid, sickle cell trait who presents for transient, moving arthralgias. On 1/13 she had her right fallopian tube removed due to an ectopic pregnancy. 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 On Tuesday she had generalized arthralgia/myalgia in her legs which she attributed to returning to work. On Wednesday her right wrist was stiff and painful; resolved. On Thursday she had bilateral scapular pain and left elbow pain; resolved. On Friday her right thumb and left shoulder were painful. This morning her right thumb/palm continued to hurt and was a little swollen. This afternoon her left shoulder and behind her left leg began to be painful. No sick contacts, no recent illness. Last sexual contact was mid Dec. Finished flagyl today for bv; Percocet did not help the pain.     ROS:  Constitutional: No fever, chills, or weakness  Skin: No rash or diaphoresis  HENT: No headaches or congestion  Eyes: No vision changes   Cardio: No chest pain, palpitations or leg swelling   Respiratory: No cough, wheezing or SOB  GI:  No nausea, vomiting, diarrhea, constipation  GU:  No dysuria, hematuria, polyuria  MSK: Right thumb, left shoulder, left knee pain  Neuro: No loss of sensation, confusion, focal deficits, numbness, tingling  Psychiatric: No mood changes  All other systems reviewed and are negative.    Medications:  Medications Prior to Admission     Prescriptions    ascorbic acid (VITAMIN C) 250 mg Oral Tablet    Take 1 Tab (250 mg total) by mouth Twice daily    buPROPion (WELLBUTRIN) 75 mg Oral Tablet    Take 150 mg by mouth Twice daily     escitalopram oxalate (LEXAPRO) 10 mg Oral Tablet    Take 1.5 Tabs (15 mg total) by mouth Once a day    Patient taking differently:  Take 20 mg by mouth Once a day     ferrous sulfate (FERATAB) 324 mg (65 mg iron) Oral  Tablet, Delayed Release (E.C.)    Take 1 Tab (324 mg total) by mouth Twice daily    Ibuprofen (MOTRIN) 600 mg Oral Tablet    Take 1 Tab (600 mg total) by mouth Every 6 hours as needed    Norethindrone Ac-Eth Estradiol (equiv to: LOESTRIN 1-20) 1-20 mg-mcg Oral Tablet    Take 1 Tab by mouth Once a day    oxyCODONE-acetaminophen (PERCOCET) 5-325 mg Oral Tablet    Take 2 Tabs by mouth Every 4 hours as needed    prenatal vitamin-iron-folate Tablet    Take 1 Tab by mouth Once a day          Allergies:  Allergies   Allergen Reactions    Sulfa (Sulfonamides) Nausea/ Vomiting and Hives/ Urticaria     Allergies reviewed with patient    Past Medical History   Diagnosis Date    Depression with anxiety     Hypothyroidism, adult     Ectopic pregnancy      treated with methotrexate    Abnormal Pap smear 2006     normal after, reports colpo, follows with Dr. Kingsley Callander    Sickle cell trait     Asthma     Ruptured ectopic pregnancy 2016         History reviewed with patient    Past Surgical History  Procedure Laterality Date    Hx tonsil and adenoidectomy      Hx tonsillectomy      Hx adenoidectomy      Hx salpingectomy Right 01/17/14           History     Social History    Marital Status: Single     Spouse Name: N/A     Number of Children: N/A    Years of Education: N/A     Occupational History     [employer]     Social History Main Topics    Smoking status: Current Every Day Smoker -- 0.50 packs/day for 10 years     Types: Cigarettes     Last Attempt to Quit: 12/23/2007    Smokeless tobacco: Not on file    Alcohol Use: 0.0 oz/week      Comment: occasional    Drug Use: No    Sexual Activity: Not on file     Other Topics Concern    Not on file     Social History Narrative    Lives with son.        Family History   Problem Relation Age of Onset    Stroke Mother     High Cholesterol Mother     Diabetes Mother     Diabetes Father            Physical Exam:  All nurse's notes  reviewed.  ED Triage Vitals   Enc Vitals Group      BP (Non-Invasive) 01/27/14 1803 136/82 mmHg      Heart Rate 01/27/14 1803 109      Respiratory Rate 01/27/14 1803 20      Temperature 01/27/14 1803 36.7 C (98.1 F)      Temp src --       SpO2-1 01/27/14 1803 100 %      Weight 01/27/14 1803 84.7 kg (186 lb 11.7 oz)      Height 01/27/14 1803 1.626 m (5' 4.02")      Head Cir --       Peak Flow --       Pain Score --       Pain Loc --       Pain Edu? --       Excl. in [place]? --      Constitutional: NAD. Appears stated age.  HENT:   Head: Normocephalic and atraumatic.   Mouth/Throat: Oropharynx is clear and moist.   Eyes: PERRL, EOMI, conjunctivae without discharge bilaterally  Neck: Trachea midline.   Cardiovascular: RRR, No murmurs, rubs or gallops.   Pulmonary/Chest: BS equal bilaterally, good air movement. No respiratory distress. No wheezes, rales or chest tenderness.   Abdominal: BS +. Abdomen soft, no tenderness, rebound or guarding.               Musculoskeletal: Right thumb MCP slightly swollen decreased ROM and tender, left posterior shoulder tenderness, decreased passive ROM above 90 deg, left knee anterior/posterior tenderness; no erythema, no warmth  Skin: warm and dry. No rash, erythema, pallor or cyanosis  Psychiatric: normal mood and affect. Behavior is normal.   Neurological: Alert&Ox3. CN II-XII grossly intact. Spontaneous mvmt x4 limbs. Sensation intact to light touch.     Labs:  Results for orders placed or performed during the hospital encounter of 01/27/14 (from the past 24 hour(s))   CBC/DIFF   Result Value Ref Range    WBC  15.6 (H) 3.5 - 11.0 THOU/uL    RBC 4.14 3.63 - 4.92 MIL/uL    HGB 11.0 (L) 11.2 - 15.2 g/dL    HCT 33.6 33.5 - 45.2 %    MCV 81.2 78 - 100 fL    MCH 26.6 (L) 27.4 - 33.0 pg    MCHC 32.7 32.5 - 35.8 g/dL    RDW 13.8 12.0 - 15.0 %    PLATELET COUNT 337 140 - 450 THOU/uL    MPV 8.9 7.5 - 11.5 fL    PMN'S 79 %    PMN ABS 12.164 (H) 1.500 - 7.700 THOU/uL    LYMPHOCYTES 14 %    LYMPHS  ABS 2.207 1.000 - 4.800 THOU/uL    MONOCYTES 6 %    MONOS ABS 0.956 0.300 - 1.000 THOU/uL    EOSINOPHIL 1 %    EOS ABS 0.186 0.000 - 0.500 THOU/uL    BASOPHILS 0 %    BASOS ABS 0.057 0.000 - 0.200 THOU/uL   B-TYPE NATRIURETIC PEPTIDE   Result Value Ref Range    B-TYPE NATRIURETIC PEPTIDE 14 <100 pg/mL   HCG, SERUM QUALITATIVE, PREGNANCY   Result Value Ref Range    PREGNANCY, SERUM QUALITATIVE POSITIVE (A) NEGATIVE   SEDIMENTATION RATE   Result Value Ref Range    SEDIMENTATION RATE 34 (H) 0 - 20 mm/hr   C-REACTIVE PROTEIN(CRP),INFLAMMATION   Result Value Ref Range    C-REACTIVE PROTEIN HIGH SENSITIVITY (INFLAMMATION) 45.6 (H) <8.0 mg/L   THYROID STIMULATING HORMONE WITH FREE T4 REFLEX   Result Value Ref Range    THYROID STIMULATING HORMONE WITH FREE T4 REFLEX 1.139 0.350 - 5.000 uIU/mL   CREATINE KINASE (CK), TOTAL, SERUM   Result Value Ref Range    CREATINE KINASE (CK) 92 25 - 190 U/L   HCG, PLASMA OR SERUM QUANTITATIVE, PREGNANCY   Result Value Ref Range    HCG QUANTITATIVE/PREG 85 (H) <5 mIU/mL   BASIC METABOLIC PANEL, NON-FASTING   Result Value Ref Range    SODIUM 139 136 - 145 mmol/L    POTASSIUM 3.6 3.5 - 5.1 mmol/L    CHLORIDE 108 96 - 111 mmol/L    CARBON DIOXIDE 22 22 - 32 mmol/L    ANION GAP 9 4 - 13 mmol/L    CREATININE 0.90 0.49 - 1.10 mg/dL    ESTIMATED GLOMERULAR FILTRATION RATE >59 >59 ml/min/1.26m    GLUCOSE,NONFAST 96 65 - 139 mg/dL    BUN 14 8 - 25 mg/dL    BUN/CREAT RATIO 16 6 - 22    CALCIUM 9.5 8.5 - 10.4 mg/dL       Imaging:  None Indicated       The differential diagnosis for this patient includes but is not limited to infectious arthralgia vs. Autoimmune arthralgia vs. other The following orders have been placed.    Orders Placed This Encounter    URINE CULTURE,ROUTINE    CBC/DIFF    B-TYPE NATRIURETIC PEPTIDE    HCG, SERUM QUALITATIVE, PREGNANCY    SEDIMENTATION RATE    C-REACTIVE PROTEIN(CRP),INFLAMMATION    THYROID STIMULATING HORMONE WITH FREE T4 REFLEX    URINALYSIS WITH CULTURE  REFLEX    URINALYSIS, MICROSCOPIC    CREATINE KINASE (CK), TOTAL, SERUM    BLOOD/SPECIMEN IN LAB    HCG, PLASMA OR SERUM QUANTITATIVE, PREGNANCY    BASIC METABOLIC PANEL, NON-FASTING    ketorolac (TORADOL) 30 mg/mL injection    NS bolus infusion 500 mL    oxyCODONE-acetaminophen (  PERCOCET) 5-325 mg Oral Tablet       Catalina Gravel, MD 01/27/2014, 20:14    Abnormal Lab results:  Labs Reviewed   CBC/DIFF - Abnormal; Notable for the following:     WBC 15.6 (*)     HGB 11.0 (*)     MCH 26.6 (*)     PMN ABS 12.164 (*)     All other components within normal limits   HCG, SERUM QUALITATIVE, PREGNANCY - Abnormal; Notable for the following:     PREGNANCY, SERUM QUALITATIVE POSITIVE (*)     All other components within normal limits   SEDIMENTATION RATE - Abnormal; Notable for the following:     SEDIMENTATION RATE 34 (*)     All other components within normal limits   C-REACTIVE PROTEIN(CRP),INFLAMMATION - Abnormal; Notable for the following:     C-REACTIVE PROTEIN HIGH SENSITIVITY (INFLAMMATION) 45.6 (*)     All other components within normal limits   HCG, PLASMA OR SERUM QUANTITATIVE, PREGNANCY - Abnormal; Notable for the following:     HCG QUANTITATIVE/PREG 85 (*)     All other components within normal limits   URINE CULTURE,ROUTINE   B-TYPE NATRIURETIC PEPTIDE   THYROID STIMULATING HORMONE WITH FREE T4 REFLEX   CREATINE KINASE (CK), TOTAL, SERUM   BLOOD/SPECIMEN IN LAB   BASIC METABOLIC PANEL, NON-FASTING   URINALYSIS WITH CULTURE REFLEX   URINALYSIS, MICROSCOPIC       Plan: Appropriate labs and imaging ordered. Medical Records reviewed.    Therapy/Procedures/Course/MDM: Patient was vitally stable throughout visit.  Required Toradol medical therapy for pain control.  Required no medical therapy for nausea control.  Additionally, patient received IV NS, NC O2.  Patient had joint pain in symptoms over course of ED stay.  Laboratory results remarkable for leukocytosis, elevated ESR/CRP. Results were discussed with  patient. They were given an opportunity to ask questions.  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  Disposition:    Home    Following the above history, physical exam, and studies, the patient was deemed stable and suitable for discharge and she will follow up early next week with PCP at [facility]. Prescriptions were written for Percocet. Return to work written for Monday. Medication instructions were discussed with the patient. Patient was advised to return to the ED with any new, concerning or worsening symptoms and follow up as directed. The patient verbalized understanding of all instructions and had no further questions or concerns.       Catalina Gravel, MD 01/27/2014, 20:14

## 2014-01-27 NOTE — ED Attending Handoff Note (Signed)
Patient care transferred from Dr. Nelda Marseilleadros   Elizabeth Landry is a 39 y.o. female w migratory arthralgias. Recent surgical treatment for ectopic pregnancy. No systemic symptoms.   Pending: labs    Inflammatory markers slightly elevated. No focal arthralgia.   Deemed stable for discharge. Close return precautions and follow-up w PCP and Rheumatology    Clydell HakimErin Leigh Siddhartha Hoback, MD 01/27/2014, 18:56  Emergency Medicine Attending

## 2014-01-27 NOTE — Discharge Instructions (Signed)
--  Follow-up with [facility]CP [facility] early in the week  --return to ED if fever, chills, nausea, vomiting etc. Or joints with redness/warmth, significant swelling

## 2014-01-27 NOTE — ED Attending Note (Signed)
Note begun by:  Jesse SansAllison Janita Camberos, MD 01/27/2014, 18:29    I was physically present and directly supervised this patient's care.  Patient seen and examined.  Resident / Trixie DredgeMidlevel / NP history and exam reviewed.   Key elements in addition to and/or correction of that documentation are as follows:    HPI :    39 y.o. female presents with chief complaint of joint pain. On 1/13 she had an ectopic pregnancy surgery. This past Mon started work again and noted diffuse arthralgias and myalgias. On Wed she noted some R wrist stiffness. Thursday it was b/l scapular and L elbow pain. Then yesterday it was R thumb and L shoulder pain. As a new area starts to hurt, the prior one resolves. No systemic symptoms. No fever or rashes. No GI symptoms no SOB. No sweats. Recently finished flagyl for BV. Hx of sickle cell trait but says she has never had any problems from that.     PE :   VS on presentation: Blood pressure 136/82, pulse 109, temperature 36.7 C (98.1 F), resp. rate 20, height 1.626 m (5' 4.02"), weight 84.7 kg (186 lb 11.7 oz), last menstrual period 01/17/2014, SpO2 100 %.  She is well appearing. Heart RRR,lungs clear. Her R thumb MCP joint is stiff and mildly swollen. Also some posterior shoulder tenderness on the L. None of the joints are red or warm. abd soft and nontender. No adenopathy.   Data/Test :          Review of Prior Data :             Clinical Impression :     arthralgias  MDM :       ED Course :        Plan :     Signed out with work up in process  CRITICAL CARE : None

## 2014-01-27 NOTE — ED Nurses Note (Signed)
Pt here today with c/o joint pain, and general illness. Pt had R fallopian tube removed on Wednesday after ectopic pregnancy. Pt has sickle cell trait. Pt reports her joint pain began a few days ago and moves to different spots everyday. Pt reports swelling at times, no redness or warmth. Pt is A&O x 4, pt denies sob, chest pain, or fevers. No n/v/d.

## 2014-01-27 NOTE — ED Nurses Note (Signed)
Pt laying in bed, alert and oriented x3.  Breathing even and unlabored.  Denies chest pain, nausea or vomiting, or shortness of breath.  Skin warm and dry.  C/o left arm, left knee, and rt thumb pain.  C/o sharp stabbing pain, rate 7/10.  No edema observed.  Denies other needs.  hm

## 2014-01-27 NOTE — ED Nurses Note (Signed)
Pt discharged to home with instructions.  Pt verbalized understanding.  Written rx for percocet given with instructions.  Denies questions.  Pt wheeled to lobby to wait for family.  hm

## 2014-01-29 NOTE — Addendum Note (Signed)
Addendum  created 01/29/14 0731 by Judithe ModestEllison, [place] B, MD    Modules edited: Anesthesia Attestations

## 2014-02-06 ENCOUNTER — Ambulatory Visit: Payer: Self-pay | Attending: Obstetrics & Gynecology | Admitting: Obstetrics & Gynecology

## 2014-02-06 ENCOUNTER — Encounter (HOSPITAL_BASED_OUTPATIENT_CLINIC_OR_DEPARTMENT_OTHER): Payer: Self-pay | Admitting: Obstetrics & Gynecology

## 2014-02-06 VITALS — BP 134/86 | HR 87 | Ht 64.0 in | Wt 179.9 lb

## 2014-02-06 DIAGNOSIS — J45909 Unspecified asthma, uncomplicated: Secondary | ICD-10-CM | POA: Insufficient documentation

## 2014-02-06 DIAGNOSIS — E039 Hypothyroidism, unspecified: Secondary | ICD-10-CM | POA: Insufficient documentation

## 2014-02-06 DIAGNOSIS — O009 Unspecified ectopic pregnancy without intrauterine pregnancy: Secondary | ICD-10-CM

## 2014-02-06 DIAGNOSIS — D573 Sickle-cell trait: Secondary | ICD-10-CM | POA: Insufficient documentation

## 2014-02-06 DIAGNOSIS — F329 Major depressive disorder, single episode, unspecified: Secondary | ICD-10-CM | POA: Insufficient documentation

## 2014-02-06 DIAGNOSIS — Z4889 Encounter for other specified surgical aftercare: Secondary | ICD-10-CM

## 2014-02-06 NOTE — Progress Notes (Addendum)
[place] [facility]ne[facility]      POST OP ENCOUNTER    PATIENT: Elizabeth Landry  CHART NUMBER: [id number]  DATE OF SERVICE: 02/06/2014    S: 39 y.o. [id number] w/ PMH of Sickle Cell trait/Depression/Asthma/Hypothyroid presenting to clinic for post op visit. Patient is status post Operative Laparoscopy, Right Salpingectomy, Evacuation of Hemoperitoneum on 01/17/14 for ruptured right ectopic pregnancy. She has been doing well, she denies any abdominal pain or vaginal bleeding. She reports that abdominal pain completely resolved post surgery. Patient with PMH of left ectopic pregnancy with Methotrexate treatment.  Patient reports joint pain since surgery.     O:   Filed Vitals:    02/06/14 1340   BP: 134/86   Pulse: 87   Height: 1.626 m (5\' 4" )   Weight: 81.6 kg (179 lb 14.3 oz)       Gen: NAD  Abd: soft, non tender, laparoscopic incisions well healed  Ext: no edema or calf tenderness    Final Pathologic Diagnosis  FALLOPIAN TUBE, RIGHT, SALPINGECTOMY:  -Findings consistent with a ruptured ectopic pregnancy      A/P: 39 y.o. [id number] presenting to clinic for Post Op Visit   Patient is status post Operative Laparoscopy, Right Salpingectomy, Evacuation of Hemoperitoneum on 01/17/14 for ruptured right ectopic pregnancy   Discussed [name] Syndrome findings, patient denies any Hx of PID    Recent GC/C testing negative   Patient to start Loestrin   Recommend an HSG prior to next planned pregnancy to evaluate patency of remaining left tube   Joint pain most likely due to viral illness, patient has Rheumatology appt scheduled   Return to clinic for Annual Visit       Rawan El-Amin, DO 02/06/2014, 13:34  PGY-4  [facility]  Department of Obstetrics & Gynecology      Late entry for 02/06/14. I saw and examined the patient.  I reviewed the resident's note.  I agree with the findings and plan of care as documented in the resident's note.  Any exceptions/additions are edited/noted.    Catha NottinghamShon P Roemello Speyer, MD  02/08/2014, 07:36

## 2014-05-21 ENCOUNTER — Encounter (HOSPITAL_BASED_OUTPATIENT_CLINIC_OR_DEPARTMENT_OTHER): Payer: Self-pay | Admitting: HEMATOLOGY/ONCOLOGY

## 2014-05-21 ENCOUNTER — Ambulatory Visit
Admission: RE | Admit: 2014-05-21 | Discharge: 2014-05-21 | Disposition: A | Discharge status: Home / Self Care | Payer: 59 | Source: Ambulatory Visit | Attending: HEMATOLOGY/ONCOLOGY | Admitting: HEMATOLOGY/ONCOLOGY

## 2014-05-21 ENCOUNTER — Ambulatory Visit (HOSPITAL_BASED_OUTPATIENT_CLINIC_OR_DEPARTMENT_OTHER)
Admission: RE | Admit: 2014-05-21 | Discharge: 2014-05-21 | Disposition: A | Discharge status: Home / Self Care | Payer: 59 | Source: Ambulatory Visit | Attending: HEMATOLOGY/ONCOLOGY | Admitting: HEMATOLOGY/ONCOLOGY

## 2014-05-21 VITALS — BP 128/73 | HR 85 | Temp 99.0°F | Resp 18 | Ht 64.0 in | Wt 184.5 lb

## 2014-05-21 DIAGNOSIS — Z862 Personal history of diseases of the blood and blood-forming organs and certain disorders involving the immune mechanism: Secondary | ICD-10-CM | POA: Insufficient documentation

## 2014-05-21 DIAGNOSIS — R5383 Other fatigue: Secondary | ICD-10-CM | POA: Insufficient documentation

## 2014-05-21 DIAGNOSIS — F329 Major depressive disorder, single episode, unspecified: Secondary | ICD-10-CM | POA: Insufficient documentation

## 2014-05-21 DIAGNOSIS — F17210 Nicotine dependence, cigarettes, uncomplicated: Secondary | ICD-10-CM | POA: Insufficient documentation

## 2014-05-21 DIAGNOSIS — F419 Anxiety disorder, unspecified: Secondary | ICD-10-CM | POA: Insufficient documentation

## 2014-05-21 DIAGNOSIS — D509 Iron deficiency anemia, unspecified: Secondary | ICD-10-CM | POA: Insufficient documentation

## 2014-05-21 LAB — BLOOD CELL COUNT W/DIFF - CANCER CENTER
BASOPHILS: 0 %
BASOS ABS: 0.046 THOU/uL (ref 0.000–0.200)
EOS ABS: 0.186 10*3/uL (ref 0.000–0.500)
EOSINOPHIL: 2 %
HCT: 39 % (ref 33.5–45.2)
HGB: 13.2 g/dL (ref 11.2–15.2)
LYMPHOCYTES: 30 %
LYMPHS ABS: 3.151 10*3/uL (ref 1.000–4.800)
MCH: 26.9 pg — ABNORMAL LOW (ref 27.4–33.0)
MCHC: 33.9 g/dL (ref 32.5–35.8)
MCV: 79.5 fL (ref 78–100)
MONOCYTES: 7 %
MONOS ABS: 0.729 10*3/uL (ref 0.300–1.000)
MPV: 8.4 fL (ref 7.5–11.5)
PLATELET COUNT (AUTO): 307 THOU/uL (ref 140–450)
PLATELET COUNT: 307 10*3/uL (ref 140–450)
PMN ABS (AUTO): 6.532 THOU/uL (ref 1.5–7.7)
PMN ABS: 6.532 10*3/uL (ref 1.500–7.700)
RBC: 4.91 MIL/uL (ref 3.63–4.92)
RDW: 13.5 % (ref 12.0–15.0)
WBC: 10.6 THOU/uL (ref 3.5–11.0)

## 2014-05-21 LAB — RETICULOCYTE COUNT
IMMATURE RETIC FRACTION: 0.49 (ref 0.29–0.53)
MEAN RETIC VOLUME: 97.9 fL (ref 97.4–120.2)
RETICULOCYTE COUNT %: 1.52 % (ref 0.5–2.0)
RETICULOCYTE COUNT ABS: 74.7 10*3/uL (ref 22.1–96.3)

## 2014-05-21 LAB — IRON TRANSFERRIN AND TIBC
IRON BINDING CAPACITY: 393 ug/dL (ref 260–400)
IRON SATURATION: 18 % (ref 16–50)

## 2014-05-21 LAB — FOLATE: FOLATE: 15.6 ng/mL (ref >6.9)

## 2014-05-21 LAB — VITAMIN B12: VITAMIN B12: 796 pg/mL (ref 200–1000)

## 2014-05-21 LAB — IRON: IRON: 69 ug/dL (ref 45–170)

## 2014-05-21 LAB — FERRITIN: FERRITIN: 71 ng/mL (ref 10–200)

## 2014-05-21 LAB — LDH: LDH: 196 U/L (ref 125–220)

## 2014-05-21 NOTE — Nurses Notes (Signed)
New pt assessment completed. A. Jasraj Lappe, RN, OCN.

## 2014-05-21 NOTE — Cancer Center Note (Addendum)
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 female accompanied by mother who provided some of the history. Ms. Delford FieldWright was diagnosed at some point during childhood with a  "sickle cell trait." No history of sickle cell in the family. She was admitted to the hospital in January for ectopic pregnancy. She had a operative laparoscopy, right salpingectomy, and evacuation of hemoperitoneum on January 13. She reports that for three weeks following surgery she had pain that started in her elbows and shoulders and then went to legs effecting her knees. She was taking Percocet along with Motrin with minimal effect. She reports that the pain was debilitating as she needed help to dress. She denies pain currently. She has never had pain like this in the past. She is here today to find out if this pain could have been caused by her history of possible sickle trait.    REVIEW OF SYSTEMS:  General: (-) pain. (-) fevers (-) chills. (-) weight loss. (+) fatigue which has increased since surgery  Lymphatic: (-) palpable masses. (-) night sweats.  Heme: (+) easy bruising (-) bleeding.  (-) recurrent infections.   HEENT. (-) vision changes (-) hearing changes. (-) dysphagia. (-) sore throat.   Heart: (-) chest pain. (-) palpitation. (-) orthopnea. (-) LE edema.   Lungs: (-) dyspnea (on exertion) (-) hemoptysis. (+) cough. Described as smoker's cough  Abdomen: (-) poor appetite. (-) abdominal pain. (-) nausea (-) vomiting. (-) diarrhea. (-) constipation.   GU: (-) dysuria (-) Urgency. (-) Hematuria.   MS. (+) joint pain (-)  ext swelling. (+) Back pain.  Mainly shoulder pain  Dermatologic: (-) rashes. (-) pruritus.   Psychiatric: (-) Depression. (-) anxiety. (-) insomnia.   Neurologic: (-) headaches. (-) neuropathy. (-) weakness. (-) memory problems. Problems with numbness and tingling in hands with weaker grip.     Other review of systems negative.     PAST MEDICAL HISTORY:  Past Medical History   Diagnosis Date   . Depression with anxiety    . Hypothyroidism, adult    . Ectopic pregnancy      treated with methotrexate   . Abnormal Pap smear 2006     normal after, reports colpo, follows with Dr. Jeanella CaraLatiffe   . Sickle cell trait    . Asthma    . Ruptured ectopic pregnancy 2016       MEDICATIONS:  Current Outpatient Prescriptions   Medication Sig   . buPROPion (WELLBUTRIN) 75 mg Oral Tablet Take 150 mg by mouth Twice daily    . escitalopram oxalate (LEXAPRO) 10 mg Oral Tablet Take 1.5 Tabs (15 mg total) by mouth Once a day (Patient taking differently: Take 20 mg by mouth Once a day )   . Ibuprofen (MOTRIN) 600 mg Oral Tablet Take 1 Tab (600 mg total) by mouth Every 6 hours as needed   . Norethindrone Ac-Eth Estradiol (equiv to: LOESTRIN 1-20) 1-20 mg-mcg Oral Tablet Take 1 Tab by mouth Once a day   . prenatal vitamin-iron-folate Tablet  Take 1 Tab by mouth Once a day       ALLERGIES:  Allergies   Allergen Reactions   . Sulfa (Sulfonamides) Nausea/ Vomiting and Hives/ Urticaria     PAST SURGICAL HISTORY:  Past Surgical History   Procedure Laterality Date   . Hx tonsil and adenoidectomy     . Hx tonsillectomy     . Hx adenoidectomy     . Hx salpingectomy Right 01/17/14         SOCIAL HISTORY:  History     Social History   . Marital Status: Single     Spouse Name: N/A   . Number of Children: N/A   . Years of Education: N/A     Occupational History   .  [employer]     pharmacy tech     Social History Main Topics   . Smoking status: Current Every Day Smoker -- 0.75 packs/day for 10 years     Types: Cigarettes     Last Attempt to Quit:  12/23/2007   . Smokeless tobacco: Not on file   . Alcohol Use: 1.2 - 1.8 oz/week     2-3 Glasses of wine per week      Comment: occasional   . Drug Use: No   . Sexual Activity: Not on file     Other Topics Concern   . Not on file     Social History Narrative    Lives with son.      FAMILY HISTORY:  Family History   Problem Relation Age of Onset   . Stroke Mother    . High Cholesterol Mother    . Diabetes Father    . Cancer Maternal Grandfather    . Diabetes Paternal Grandmother      PHYSICAL EXAMINATION:  Vitals:   Most Recent Vitals       New Patient Visit from 05/21/2014 in [facility]    Temperature 37.2 C (99 F) filed at... 05/21/2014 1402    Heart Rate 85 filed at... 05/21/2014 1402    Respiratory Rate 18 filed at... 05/21/2014 1402    BP (Non-Invasive) 128/73 mmHg filed at... 05/21/2014 1402    Height 1.626 m (5\' 4" ) filed at... 05/21/2014 1402    Weight 83.7 kg (184 lb 8.4 oz) filed at... 05/21/2014 1402    BMI (Calculated) 31.74 filed at... 05/21/2014 1402    BSA (Calculated) 1.94 filed at... 05/21/2014 1402        ECOG PS 0 - Fully active, able to carry on all pre-disease performance without restriction.     General: appears in good health and no distress  Eyes: Pupils equal and round, reactive to light and accomodation.   HENT:Mouth mucous membranes moist. , No oral lesions.   Neck: supple, symmetrical, trachea midline and no adenopathy  Lungs: clear to auscultation bilaterally.   Cardiovascular:    Heart regular rate and rhythm  Abdomen: soft, non-tender, bowel sounds normal and no hepatosplenomegaly  Extremities: No edema  Skin: Skin warm and dry, No rashes and No lesions  Neurologic: alert and oriented x3 and motor: No weakness.   Psychiatric: AOx3, behavior normal and thought content normal      LABORATORY:  No results found for this or any previous visit (from the past 72 hour(s)).  Hemoglobin A 61.6%  Hemoglobin C 35.1%    IMAGING:  N/A    ASSESSMENT: 39 y.o. female here for evaluation of possible  sickle cell trait and recent unexplained  exacerbated of systemic pain following surgery.       PLAN:  1. History of reported sickle cell trait- She states that she was diagnosed as a child as having "a sickle cell trait". Because of this history, I ordered a hemoglobin electrophoresis study and results are back at the time of this note. 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 However if she has children with anyone who caries the sickle cell trait (Hemoglobin AS), her child would have a 25% chance of having Hemoglobin SC disease (Sickle-hemoglobin disease.) I will discuss this with the patient at her follow up visit.    2. Iron Deficiency Anemia- Reported history of iron deficiency anemia. She stopped taking iron in March for no apparent reason. She says she has worsening fatigue. I will order a CBC, folate, Vit B12, Iron, TIBC, Ferritin, and reticulocyte count along with LDH and haptoglobin to assess for anemia    Follow-up in four weeks to discuss results of labs.     This patient was seen as a shared visit with Dr. Vinnie Level.     Elizabeth Lesches APRN, FNP    I personally saw and examined the patient. See Nurse Practitioner's note for additional details. My findings are that the patient's episode of systemic pain does not fit with the history of a sickle cell trait. I ordered hemoglobin electrophoresis and the patient has Hemoglobin C trait.      Orlene Plum, DO   Assistant Professor   Section of Hematology/Oncology  [facility] Department of Medicine

## 2014-05-22 LAB — GIEMSA STAIN, WITH PATHOLOGIST REVIEW

## 2014-05-22 LAB — HAPTOGLOBIN, SERUM: HAPTOGLOBIN: 179 mg/dL (ref 32–197)

## 2014-05-24 LAB — HEMOGLOBIN ELECTROPHORESIS CASCADE, BLOOD
HEMOGLOBIN A2: 3.3 %
HEMOGLOBIN F: 0 %
VARIANT: 35.1 %

## 2014-06-18 ENCOUNTER — Ambulatory Visit (HOSPITAL_BASED_OUTPATIENT_CLINIC_OR_DEPARTMENT_OTHER)
Admission: RE | Admit: 2014-06-18 | Discharge: 2014-06-18 | Disposition: A | Discharge status: Home / Self Care | Payer: 59 | Source: Ambulatory Visit

## 2014-06-18 ENCOUNTER — Ambulatory Visit
Admission: RE | Admit: 2014-06-18 | Discharge: 2014-06-18 | Disposition: A | Discharge status: Home / Self Care | Payer: 59 | Source: Ambulatory Visit | Attending: HEMATOLOGY/ONCOLOGY | Admitting: HEMATOLOGY/ONCOLOGY

## 2014-06-18 ENCOUNTER — Encounter (HOSPITAL_BASED_OUTPATIENT_CLINIC_OR_DEPARTMENT_OTHER): Payer: Self-pay | Admitting: HEMATOLOGY/ONCOLOGY

## 2014-06-18 DIAGNOSIS — Z862 Personal history of diseases of the blood and blood-forming organs and certain disorders involving the immune mechanism: Secondary | ICD-10-CM

## 2014-06-18 DIAGNOSIS — R05 Cough: Secondary | ICD-10-CM | POA: Insufficient documentation

## 2014-06-18 DIAGNOSIS — F17210 Nicotine dependence, cigarettes, uncomplicated: Secondary | ICD-10-CM | POA: Insufficient documentation

## 2014-06-18 DIAGNOSIS — F418 Other specified anxiety disorders: Secondary | ICD-10-CM | POA: Insufficient documentation

## 2014-06-18 DIAGNOSIS — D582 Other hemoglobinopathies: Secondary | ICD-10-CM

## 2014-06-18 DIAGNOSIS — D573 Sickle-cell trait: Secondary | ICD-10-CM | POA: Insufficient documentation

## 2014-06-18 DIAGNOSIS — M2550 Pain in unspecified joint: Secondary | ICD-10-CM | POA: Insufficient documentation

## 2014-06-18 DIAGNOSIS — M549 Dorsalgia, unspecified: Secondary | ICD-10-CM | POA: Insufficient documentation

## 2014-06-18 DIAGNOSIS — R5383 Other fatigue: Secondary | ICD-10-CM | POA: Insufficient documentation

## 2014-06-18 DIAGNOSIS — D509 Iron deficiency anemia, unspecified: Secondary | ICD-10-CM

## 2014-06-18 NOTE — Cancer Center Note (Signed)
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    Elizabeth Landry is a 39 y.o. female accompanied by mother who provided some of the history. Ms. Diosdado was diagnosed at some point during childhood with a  "sickle cell trait." No history of sickle cell in the family. She was admitted to the hospital in January for ectopic pregnancy. She had a operative laparoscopy, right salpingectomy, and evacuation of hemoperitoneum on January 13. She reports that for three weeks following surgery she had pain that started in her elbows and shoulders and then went to legs effecting her knees. She was taking Percocet along with Motrin with minimal effect. She reports that the pain was debilitating as she needed help to dress. She denies pain currently. She has never had pain like this in the past. She is here today to find out if this pain could have been caused by her history of possible sickle trait.    REVIEW OF SYSTEMS:  General: (-) pain. (-) fevers (-) chills. (-) weight loss. (+) fatigue which has increased since surgery  Lymphatic: (-) palpable masses. (-) night sweats.  Heme: (+) easy bruising (-) bleeding.  (-) recurrent infections.   HEENT. (-) vision changes (-) hearing changes. (-) dysphagia. (-) sore throat.   Heart: (-) chest pain. (-) palpitation. (-) orthopnea. (-) LE edema.   Lungs: (-) dyspnea (on exertion) (-) hemoptysis. (+) cough. Described as smoker's cough  Abdomen: (-) poor appetite. (-)  abdominal pain. (-) nausea (-) vomiting. (-) diarrhea. (-) constipation.   GU: (-) dysuria (-) Urgency. (-) Hematuria.   MS. (+) joint pain (-) ext swelling. (+) Back pain.  Mainly shoulder pain  Dermatologic: (-) rashes. (-) pruritus.   Psychiatric: (-) Depression. (-) anxiety. (-) insomnia.   Neurologic: (-) headaches. (-) neuropathy. (-) weakness. (-) memory problems. Problems with numbness and tingling in hands with weaker grip.     Other review of systems negative.     PAST MEDICAL HISTORY:  Past Medical History   Diagnosis Date   . Depression with anxiety    . Hypothyroidism, adult    . Ectopic pregnancy      treated with methotrexate   . Abnormal Pap smear 2006     normal after, reports colpo, follows with Dr. Jeanella Cara   . Sickle cell trait    . Asthma    . Ruptured ectopic pregnancy 2016       MEDICATIONS:  Current Outpatient Prescriptions   Medication Sig   . buPROPion (WELLBUTRIN) 75 mg Oral Tablet Take 150 mg by mouth Twice daily    . escitalopram oxalate (LEXAPRO) 10 mg Oral Tablet Take 1.5 Tabs (15 mg total) by mouth Once a day (Patient taking differently: Take 20 mg by mouth Once a day )   . Ibuprofen (MOTRIN) 600 mg Oral Tablet Take 1 Tab (600 mg total) by mouth Every 6 hours as needed   . Norethindrone Ac-Eth  Estradiol (equiv to: LOESTRIN 1-20) 1-20 mg-mcg Oral Tablet Take 1 Tab by mouth Once a day   . prenatal vitamin-iron-folate Tablet Take 1 Tab by mouth Once a day       ALLERGIES:  Allergies   Allergen Reactions   . Sulfa (Sulfonamides) Nausea/ Vomiting and Hives/ Urticaria     PAST SURGICAL HISTORY:  Past Surgical History   Procedure Laterality Date   . Hx tonsil and adenoidectomy     . Hx tonsillectomy     . Hx adenoidectomy     . Hx salpingectomy Right 01/17/14         SOCIAL HISTORY:  History     Social History   . Marital Status: Single     Spouse Name: N/A   . Number of Children: N/A   . Years of Education: N/A     Occupational History   .  [employer]     pharmacy tech     Social  History Main Topics   . Smoking status: Current Every Day Smoker -- 0.75 packs/day for 10 years     Types: Cigarettes     Last Attempt to Quit: 12/23/2007   . Smokeless tobacco: Not on file   . Alcohol Use: 1.2 - 1.8 oz/week     2-3 Glasses of wine per week      Comment: occasional   . Drug Use: No   . Sexual Activity: Not on file     Other Topics Concern   . Not on file     Social History Narrative    Lives with son.      FAMILY HISTORY:  Family History   Problem Relation Age of Onset   . Stroke Mother    . High Cholesterol Mother    . Diabetes Father    . Cancer Maternal Grandfather    . Diabetes Paternal Grandmother      PHYSICAL EXAMINATION:  Vitals:   Most Recent Vitals       Lab from 06/18/2014 in [facility]    Temperature 36.3 C (97.3 F) filed at... 06/18/2014 0932    Heart Rate 78 filed at... 06/18/2014 0932    Respiratory Rate 18 filed at... 06/18/2014 0932    BP (Non-Invasive) 131/82 mmHg filed at... 06/18/2014 0932    Height 1.626 m ( ) filed at... 06/18/2014 0932    Weight 84.1 kg (185 lb 6.5 oz) filed at... 06/18/2014 0932    BMI (Calculated) 31.89 filed at... 06/18/2014 0932    BSA (Calculated) 1.95 filed at... 06/18/2014 0932          ECOG PS 0 - Fully active, able to carry on all pre-disease performance without restriction.     General: appears in good health and no distress  Eyes: Pupils equal and round, reactive to light and accomodation.   HENT:Mouth mucous membranes moist. , No oral lesions.   Neck: supple, symmetrical, trachea midline and no adenopathy  Lungs: clear to auscultation bilaterally.   Cardiovascular:    Heart regular rate and rhythm  Abdomen: soft, non-tender, bowel sounds normal and no hepatosplenomegaly  Extremities: No edema  Skin: Skin warm and dry, No rashes and No lesions  Neurologic: alert and oriented x3 and motor: No weakness.   Psychiatric: AOx3, behavior normal and thought content normal      LABORATORY:  No results found for this or any previous visit (from the  past 72 hour(s)).  Hemoglobin A 61.6%  Hemoglobin C  35.1%    IMAGING:  N/A    ASSESSMENT: 39 y.o. female here for evaluation of Hemoglobin C trait and recent unexplained exacerbation of systemic pain following surgery.       PLAN:  1. Hemoglobin C trait- clinically insignificant. This does not produce sickling of cells.  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 However if she has children with anyone who caries the sickle cell trait (Hemoglobin AS), her child would have a 25% chance of having Hemoglobin SC disease (Sickle-hemoglobin disease.) I discussed this with the patient and gave her a hand out explaining the trait and the genetics of it.     2. Iron Deficiency Anemia- resolved.    No follow up needed.    Orlene Plum, DO   Assistant Professor   Section of Hematology/Oncology  [facility] Department of Medicine

## 2014-06-19 ENCOUNTER — Encounter (HOSPITAL_BASED_OUTPATIENT_CLINIC_OR_DEPARTMENT_OTHER): Payer: Self-pay | Admitting: HEMATOLOGY/ONCOLOGY

## 2014-10-06 ENCOUNTER — Other Ambulatory Visit: Payer: Self-pay

## 2015-09-07 ENCOUNTER — Other Ambulatory Visit: Payer: Self-pay

## 2016-05-21 ENCOUNTER — Ambulatory Visit (INDEPENDENT_AMBULATORY_CARE_PROVIDER_SITE_OTHER): Payer: No Typology Code available for payment source | Admitting: Family Medicine

## 2016-05-21 ENCOUNTER — Ambulatory Visit: Payer: Self-pay | Attending: Family Medicine

## 2016-05-21 ENCOUNTER — Encounter (INDEPENDENT_AMBULATORY_CARE_PROVIDER_SITE_OTHER): Payer: Self-pay | Admitting: Family Medicine

## 2016-05-21 VITALS — BP 123/92 | HR 79 | Temp 98.3°F | Resp 16 | Ht 64.0 in | Wt 182.2 lb

## 2016-05-21 DIAGNOSIS — M2550 Pain in unspecified joint: Secondary | ICD-10-CM | POA: Insufficient documentation

## 2016-05-21 DIAGNOSIS — F419 Anxiety disorder, unspecified: Secondary | ICD-10-CM

## 2016-05-21 DIAGNOSIS — F32A Depression, unspecified: Secondary | ICD-10-CM

## 2016-05-21 DIAGNOSIS — M791 Myalgia, unspecified site: Secondary | ICD-10-CM

## 2016-05-21 DIAGNOSIS — F329 Major depressive disorder, single episode, unspecified: Secondary | ICD-10-CM

## 2016-05-21 DIAGNOSIS — R5383 Other fatigue: Secondary | ICD-10-CM | POA: Insufficient documentation

## 2016-05-21 LAB — COMPREHENSIVE METABOLIC PANEL, NON-FASTING
ALBUMIN/GLOBULIN RATIO: 1.8 (ref 0.8–2.0)
ALBUMIN/GLOBULIN RATIO: 1.8 (ref 0.8–2.0)
ALBUMIN: 4.4 g/dL (ref 3.5–5.0)
ALKALINE PHOSPHATASE: 91 U/L (ref 38–126)
ALT (SGPT): 19 U/L (ref 14–54)
ANION GAP: 7 mmol/L (ref 3–11)
AST (SGOT): 18 U/L (ref 15–41)
BILIRUBIN TOTAL: 0.7 mg/dL (ref 0.3–1.2)
BUN/CREA RATIO: 8 (ref 6–22)
BUN: 7 mg/dL (ref 6–20)
CALCIUM: 9.3 mg/dL (ref 8.6–10.3)
CHLORIDE: 103 mmol/L (ref 101–111)
CO2 TOTAL: 29 mmol/L (ref 22–32)
CREATININE: 0.87 mg/dL (ref 0.44–1.00)
ESTIMATED GFR: >60 mL/min/1.73mˆ2 (ref >60)
GLUCOSE: 94 mg/dL (ref 70–110)
POTASSIUM: 3.8 mmol/L (ref 3.4–5.1)
PROTEIN TOTAL: 6.9 g/dL (ref 6.4–8.3)
SODIUM: 139 mmol/L (ref 136–145)

## 2016-05-21 LAB — CBC WITH DIFF
BASOPHIL #: 0.1 x10?3/uL (ref 0.00–0.10)
BASOPHIL %: 1 % (ref 0–3)
EOSINOPHIL #: 0.1 x10ˆ3/uL (ref 0.00–0.50)
EOSINOPHIL %: 1 % (ref 0–5)
HCT: 37.7 % (ref 36.0–45.0)
HCT: 37.7 % (ref 36.0–45.0)
HGB: 13 g/dL (ref 12.0–15.5)
LYMPHOCYTE #: 3 x10ˆ3/uL (ref 1.00–4.80)
LYMPHOCYTE %: 30 % (ref 15–43)
MCH: 27.4 pg — ABNORMAL LOW (ref 27.5–33.2)
MCHC: 34.5 g/dL (ref 32.0–36.0)
MCV: 79.6 fL — ABNORMAL LOW (ref 82.0–97.0)
MONOCYTE #: 0.5 x10ˆ3/uL (ref 0.20–0.90)
MONOCYTE %: 6 % (ref 5–12)
MPV: 9.7 fL (ref 7.4–10.5)
NEUTROPHIL #: 6.3 x10ˆ3/uL (ref 1.50–6.50)
NEUTROPHIL %: 63 % (ref 43–76)
PLATELETS: 337 x10ˆ3/uL (ref 150–450)
RBC: 4.74 x10ˆ6/uL (ref 4.00–5.10)
RDW: 13.9 % (ref 11.0–16.0)
WBC: 10 x10ˆ3/uL (ref 4.0–11.0)

## 2016-05-21 LAB — SEDIMENTATION RATE: ERYTHROCYTE SEDIMENTATION RATE (ESR): 15 mm/h (ref 0–20)

## 2016-05-21 LAB — MAGNESIUM: MAGNESIUM: 2.5 mg/dL — ABNORMAL HIGH (ref 1.4–2.1)

## 2016-05-21 LAB — RHEUMATOID FACTOR, SERUM: RHEUMATOID FACTOR: <15 [IU]/mL (ref <=30)

## 2016-05-21 LAB — THYROID STIMULATING HORMONE WITH FREE T4 REFLEX: TSH: 1.427 u[IU]/mL (ref 0.340–5.330)

## 2016-05-21 LAB — C-REACTIVE PROTEIN(CRP),INFLAMMATION: CRP INFLAMMATION: 3.7 mg/L (ref <=10.0)

## 2016-05-21 LAB — VITAMIN B12: VITAMIN B 12: 605 pg/mL (ref 180–914)

## 2016-05-21 LAB — FOLATE: FOLATE: 10.6 ng/mL (ref >=4.5)

## 2016-05-21 MED ORDER — BUPROPION HCL SR 150 MG TABLET,12 HR SUSTAINED-RELEASE
150.00 mg | ORAL_TABLET | Freq: Two times a day (BID) | ORAL | 2 refills | Status: AC
Start: 2016-05-21 — End: ?

## 2016-05-21 MED ORDER — BUSPIRONE 15 MG TABLET: 15 mg | Tab | Freq: Two times a day (BID) | ORAL | 1 refills | 0 days | Status: DC

## 2016-05-21 MED ORDER — ESCITALOPRAM 20 MG TABLET
20.0000 mg | ORAL_TABLET | Freq: Every day | ORAL | 2 refills | Status: AC
Start: 2016-05-21 — End: ?

## 2016-05-21 NOTE — Progress Notes (Signed)
[facility]  [address]  [place] [id number]  Phone: [phone]  Fax: [phone]    Encounter Date: 05/21/2016    Patient ID:  Elizabeth Landry  ONG:[id number]    DOB: 10/12/1975  Age: 41 y.o. female    Subjective:     Chief Complaint   Patient presents with   . Establish Care     HPI   Here to establish care and needing med refills. Mentions fatigue, issue w/ thyroid. Mention arthalgia, myalgias. Stressed had to leave child behind to get better job. Says lexapro used to work for mood, now not working. No suicidal or homicidal ideation.   Current Outpatient Prescriptions   Medication Sig   . buPROPion (WELLBUTRIN SR) 150 mg Oral Tablet Sustained Release 12 hr Take 1 Tab (150 mg total) by mouth Twice daily   . busPIRone (BUSPAR) 15 mg Oral Tablet Take 1 Tab (15 mg total) by mouth Twice daily   . escitalopram oxalate (LEXAPRO) 10 mg Oral Tablet Take 1.5 Tabs (15 mg total) by mouth Once a day   . escitalopram oxalate (LEXAPRO) 20 mg Oral Tablet Take 1 Tab (20 mg total) by mouth Once a day   . Ibuprofen (MOTRIN) 600 mg Oral Tablet Take 1 Tab (600 mg total) by mouth Every 6 hours as needed (Patient not taking: Reported on 05/21/2016)   . Norethindrone Ac-Eth Estradiol (equiv to: LOESTRIN 1-20) 1-20 mg-mcg Oral Tablet Take 1 Tab by mouth Once a day (Patient not taking: Reported on 05/21/2016)   . prenatal vitamin-iron-folate Tablet Take 1 Tab by mouth Once a day     Allergies   Allergen Reactions   . Sulfa (Sulfonamides) Nausea/ Vomiting and Hives/ Urticaria     Past Medical History:   Diagnosis Date   . Abnormal Pap smear 2006    normal after, reports colpo, follows with Dr. Jeanella CaraLatiffe   . Asthma    . Depression with anxiety    . Ectopic pregnancy     treated with methotrexate   . Hypothyroidism, adult    . Ruptured ectopic pregnancy 2016   . Sickle cell trait [facility](HCC)          Past Surgical History:   Procedure Laterality Date   . HX ADENOIDECTOMY     . HX SALPINGECTOMY Right  01/17/14   . HX TONSIL AND ADENOIDECTOMY     . HX TONSILLECTOMY           Family Medical History     Problem Relation (Age of Onset)    Cancer Maternal Grandfather    Diabetes Father, Paternal Grandmother    High Cholesterol Mother    Stroke Mother            Social History   Substance Use Topics   . Smoking status: Current Every Day Smoker     Packs/day: 0.75     Years: 10.00     Types: Cigarettes     Last attempt to quit: 12/23/2007   . Smokeless tobacco: Never Used      Comment: TRYING TO QUIT   . Alcohol use 1.2 - 1.8 oz/week     2 - 3 Glasses of wine per week      Comment: occasional       Review of Systems   Constitutional: Positive for fatigue.   HENT: Negative.    Eyes: Negative.    Respiratory: Negative.    Cardiovascular: Negative.  Gastrointestinal: Negative.    Musculoskeletal: Positive for arthralgias and myalgias.   Neurological: Negative.    Psychiatric/Behavioral: Negative.      Objective:   Vitals: BP (!) 123/92  Pulse 79  Temp 36.8 C (98.3 F)  Resp 16  Ht 1.626 m (5\' 4" )  Wt 82.6 kg (182 lb 3.2 oz)  LMP 05/13/2016 (Exact Date)  SpO2 95%  Breastfeeding? No  BMI 31.27 kg/m2    Physical Exam   Constitutional: She is oriented to person, place, and time. She appears well-developed and well-nourished.   Fatigue appearing  anxious   HENT:   Head: Normocephalic and atraumatic.   Eyes: Pupils are equal, round, and reactive to light.   Neck: Normal range of motion.   Cardiovascular: Normal rate, regular rhythm and normal heart sounds.    Pulmonary/Chest: Effort normal and breath sounds normal.   Abdominal: Soft. Bowel sounds are normal.   Neurological: She is alert and oriented to person, place, and time.   Skin: Skin is warm.   Psychiatric: Her behavior is normal. Judgment and thought content normal.   anxious     Assessment & Plan:     ENCOUNTER DIAGNOSES     ICD-10-CM   1. Fatigue, unspecified type R53.83   2. Myalgia M79.1   3. Arthralgia, unspecified joint M25.50   4. Anxiety F41.9   5.  Depression, unspecified depression type F32.9     labwork ordered  Refill mood medication  Orders Placed This Encounter   . COMPREHENSIVE METABOLIC PANEL, NON-FASTING   . CBC/DIFF   . THYROID STIMULATING HORMONE WITH FREE T4 REFLEX   . FOLATE   . VITAMIN B12   . VITAMIN D 25, TOTAL   . SEDIMENTATION RATE   . C-REACTIVE PROTEIN(CRP),INFLAMMATION   . LYME ANTIBODY PANEL   . ANA (ANTINUCLEAR ANTIBODIES), SERUM   . RHEUMATOID FACTOR, SERUM   . MAGNESIUM   . PHOSPHORUS   . OUTSIDE CONSULT/REFERRAL PROVIDER(AMB)   . busPIRone (BUSPAR) 15 mg Oral Tablet   . escitalopram oxalate (LEXAPRO) 20 mg Oral Tablet   . buPROPion (WELLBUTRIN SR) 150 mg Oral Tablet Sustained Release 12 hr           Thelma Barge, MD

## 2016-05-22 LAB — VITAMIN D 25, TOTAL: VITAMIN D, 25OH: 19 ng/mL — ABNORMAL LOW (ref 30–100)

## 2016-05-22 LAB — ANA (ANTINUCLEAR ANTIBODIES), SERUM
ANTI-NUCLEAR ANTIBODIES,QUANTITATIVE: 0.19 Index Value (ref <=0.90)
ANTI-NUCLEAR ANTIBODIES,QUANTITATIVE: 0.19 {index_val} (ref <=0.90)

## 2016-05-22 LAB — LYME ANTIBODY PANEL WITH REFLEX
LYME IGG: NEGATIVE
LYME IGM: NEGATIVE

## 2016-06-18 ENCOUNTER — Encounter (INDEPENDENT_AMBULATORY_CARE_PROVIDER_SITE_OTHER): Payer: No Typology Code available for payment source | Admitting: Family Medicine

## 2016-06-26 ENCOUNTER — Encounter (INDEPENDENT_AMBULATORY_CARE_PROVIDER_SITE_OTHER): Payer: Self-pay | Admitting: HOSPITALIST

## 2016-06-26 ENCOUNTER — Ambulatory Visit (INDEPENDENT_AMBULATORY_CARE_PROVIDER_SITE_OTHER): Payer: No Typology Code available for payment source | Admitting: HOSPITALIST

## 2016-06-26 VITALS — BP 114/82 | HR 85 | Temp 98.8°F | Resp 16 | Ht 64.0 in | Wt 182.0 lb

## 2016-06-26 DIAGNOSIS — E039 Hypothyroidism, unspecified: Secondary | ICD-10-CM

## 2016-06-26 DIAGNOSIS — E559 Vitamin D deficiency, unspecified: Secondary | ICD-10-CM

## 2016-06-26 DIAGNOSIS — F418 Other specified anxiety disorders: Secondary | ICD-10-CM

## 2016-06-26 MED ORDER — BUSPIRONE 15 MG TABLET
15.0000 mg | ORAL_TABLET | Freq: Two times a day (BID) | ORAL | 1 refills | Status: AC
Start: 2016-06-26 — End: ?

## 2016-06-26 MED ORDER — NORETHINDRONE ACETATE 1 MG-ETHINYL ESTRADIOL 20 MCG TABLET
1.0000 | ORAL_TABLET | Freq: Every day | ORAL | 11 refills | Status: AC
Start: 2016-06-26 — End: ?

## 2016-06-26 MED ORDER — CHOLECALCIFEROL (VITAMIN D3) 125 MCG (5,000 UNIT) CAPSULE
5000.0000 [IU] | ORAL_CAPSULE | Freq: Every day | ORAL | 1 refills | Status: AC
Start: 2016-06-26 — End: ?

## 2016-06-26 NOTE — Progress Notes (Signed)
[facility]  [address]  [place] [id number]  Phone: [phone]  Fax: [phone]    Encounter Date: 06/26/2016    Patient ID:  Elizabeth Landry  ION:[id number]    DOB: 12/02/1975  Age: 41 y.o. female    Subjective:     Chief Complaint   Patient presents with   . Medication follow up     HPI     Pt here for follow up. Doing well. No chest pain/SOB. No fever/chills. No abd pain. No nausea/vomting. No diarrhea/constipation.      Current Outpatient Prescriptions   Medication Sig   . buPROPion (WELLBUTRIN SR) 150 mg Oral Tablet Sustained Release 12 hr Take 1 Tab (150 mg total) by mouth Twice daily   . busPIRone (BUSPAR) 15 mg Oral Tablet Take 1 Tab (15 mg total) by mouth Twice daily   . cholecalciferol, vitamin D3, 5,000 unit Oral Capsule Take 1 Cap (5,000 Units total) by mouth Once a day   . escitalopram oxalate (LEXAPRO) 20 mg Oral Tablet Take 1 Tab (20 mg total) by mouth Once a day   . Ibuprofen (MOTRIN) 600 mg Oral Tablet Take 1 Tab (600 mg total) by mouth Every 6 hours as needed (Patient not taking: Reported on 05/21/2016)   . Norethindrone Ac-Eth Estradiol (equiv to: LOESTRIN 1-20) 1-20 mg-mcg Oral Tablet Take 1 Tab by mouth Once a day   . prenatal vitamin-iron-folate Tablet Take 1 Tab by mouth Once a day   . [DISCONTINUED] busPIRone (BUSPAR) 15 mg Oral Tablet Take 1 Tab (15 mg total) by mouth Twice daily   . [DISCONTINUED] escitalopram oxalate (LEXAPRO) 10 mg Oral Tablet Take 1.5 Tabs (15 mg total) by mouth Once a day     Allergies   Allergen Reactions   . Sulfa (Sulfonamides) Nausea/ Vomiting and Hives/ Urticaria     Past Medical History:   Diagnosis Date   . Abnormal Pap smear 2006    normal after, reports colpo, follows with Dr. Jeanella CaraLatiffe   . Asthma    . Depression with anxiety    . Ectopic pregnancy     treated with methotrexate   . Hypothyroidism, adult    . Ruptured ectopic pregnancy 2016   . Sickle cell trait [facility](HCC)          Past Surgical History:   Procedure  Laterality Date   . HX ADENOIDECTOMY     . HX SALPINGECTOMY Right 01/17/14   . HX TONSIL AND ADENOIDECTOMY     . HX TONSILLECTOMY           Family Medical History     Problem Relation (Age of Onset)    Cancer Maternal Grandfather    Diabetes Father, Paternal Grandmother    High Cholesterol Mother    Stroke Mother            Social History   Substance Use Topics   . Smoking status: Current Every Day Smoker     Packs/day: 0.75     Years: 10.00     Types: Cigarettes     Last attempt to quit: 12/23/2007   . Smokeless tobacco: Never Used      Comment: TRYING TO QUIT   . Alcohol use 1.2 - 1.8 oz/week     2 - 3 Glasses of wine per week      Comment: occasional       Review of Systems   Constitutional: Negative for chills, fatigue, fever  and unexpected weight change.   HENT: Negative for congestion.    Eyes: Negative for photophobia and visual disturbance.   Respiratory: Negative for cough, chest tightness, shortness of breath and wheezing.    Cardiovascular: Negative for chest pain and palpitations.   Gastrointestinal: Negative for diarrhea, nausea and vomiting.   Musculoskeletal: Negative for arthralgias and myalgias.   Skin: Negative for rash.   Psychiatric/Behavioral: Negative for agitation and sleep disturbance. The patient is not hyperactive.      Objective:   Vitals: BP 114/82  Pulse 85  Temp 37.1 C (98.8 F)  Resp 16  Ht 1.626 m (5\' 4" )  Wt 82.6 kg (182 lb)  LMP 06/08/2016  SpO2 98%  Breastfeeding? No  BMI 31.24 kg/m2    Physical Exam   Constitutional: She is oriented to person, place, and time. She appears well-developed and well-nourished. No distress.   HENT:   Head: Normocephalic and atraumatic.   Mouth/Throat: No oropharyngeal exudate.   Eyes: Right eye exhibits no discharge. Left eye exhibits no discharge. No scleral icterus.   Neck: Normal range of motion. Neck supple.   Cardiovascular: Normal rate, regular rhythm, normal heart sounds and intact distal pulses.  Exam reveals no gallop and no friction  rub.    No murmur heard.  Pulmonary/Chest: Effort normal and breath sounds normal. No respiratory distress. She has no wheezes. She has no rales. She exhibits no tenderness.   Abdominal: Soft. Bowel sounds are normal. She exhibits no distension and no mass. There is no tenderness. There is no rebound and no guarding.   Musculoskeletal: Normal range of motion. She exhibits no edema, tenderness or deformity.   Neurological: She is alert and oriented to person, place, and time.   Skin: Skin is warm. She is not diaphoretic.   Psychiatric: She has a normal mood and affect.     Assessment & Plan:     ENCOUNTER DIAGNOSES     ICD-10-CM   1. Hypothyroidism, unspecified type E03.9   2. Anxiety associated with depression F41.8   3. Vitamin D deficiency E55.9       Orders Placed This Encounter   . busPIRone (BUSPAR) 15 mg Oral Tablet   . Norethindrone Ac-Eth Estradiol (equiv to: LOESTRIN 1-20) 1-20 mg-mcg Oral Tablet   . cholecalciferol, vitamin D3, 5,000 unit Oral Capsule       Return in about 3 months (around 09/26/2016).    Linzie Collin, MD

## 2016-09-25 ENCOUNTER — Encounter (INDEPENDENT_AMBULATORY_CARE_PROVIDER_SITE_OTHER): Payer: No Typology Code available for payment source | Admitting: HOSPITALIST

## 2016-10-10 ENCOUNTER — Other Ambulatory Visit: Payer: Self-pay

## 2016-10-29 ENCOUNTER — Telehealth (INDEPENDENT_AMBULATORY_CARE_PROVIDER_SITE_OTHER): Payer: Self-pay | Admitting: Family Medicine

## 2016-10-29 NOTE — Telephone Encounter (Signed)
es citalopram refilled at [facility] in [place].Marland Kitchen.  Sharyon CableGloria Jissel Slavens, MA  10/29/2016, 13:58

## 2017-10-14 ENCOUNTER — Telehealth: Payer: Self-pay

## 2017-10-14 NOTE — Telephone Encounter (Signed)
Copied from CRM [id number]. Topic: Appointment Scheduling - New Patient >> Sep 24, 2017  1:37 PM Arlyss Gandy, NT wrote: New patient has been scheduled for your office. Provider: Dr. Carmelia Roller Date of Appointment: 10/22/17  Route to department's PEC pool. >> Oct 14, 2017  3:11 PM Angela Nevin wrote: Pt called stating she has not received her new pt packet and would like that resent if possible

## 2017-10-22 ENCOUNTER — Ambulatory Visit (INDEPENDENT_AMBULATORY_CARE_PROVIDER_SITE_OTHER): Payer: 59 | Admitting: Family Medicine

## 2017-10-22 ENCOUNTER — Encounter: Payer: Self-pay | Admitting: Family Medicine

## 2017-10-22 VITALS — BP 128/80 | HR 89 | Temp 98.2°F | Ht 64.0 in | Wt 197.1 lb

## 2017-10-22 DIAGNOSIS — Z114 Encounter for screening for human immunodeficiency virus [HIV]: Secondary | ICD-10-CM

## 2017-10-22 DIAGNOSIS — Z0000 Encounter for general adult medical examination without abnormal findings: Secondary | ICD-10-CM | POA: Diagnosis not present

## 2017-10-22 DIAGNOSIS — Z23 Encounter for immunization: Secondary | ICD-10-CM | POA: Diagnosis not present

## 2017-10-22 DIAGNOSIS — Z1239 Encounter for other screening for malignant neoplasm of breast: Secondary | ICD-10-CM | POA: Diagnosis not present

## 2017-10-22 LAB — COMPREHENSIVE METABOLIC PANEL
ALK PHOS: 92 U/L (ref 39–117)
ALT: 12 U/L (ref 0–35)
AST: 10 U/L (ref 0–37)
Albumin: 4.3 g/dL (ref 3.5–5.2)
BUN: 11 mg/dL (ref 6–23)
CO2: 29 meq/L (ref 19–32)
Calcium: 9.5 mg/dL (ref 8.4–10.5)
Chloride: 105 mEq/L (ref 96–112)
Creatinine, Ser: 0.85 mg/dL (ref 0.40–1.20)
GFR: 95.25 mL/min (ref >60.00)
GLUCOSE: 93 mg/dL (ref 70–99)
POTASSIUM: 4.3 meq/L (ref 3.5–5.1)
SODIUM: 141 meq/L (ref 135–145)
TOTAL PROTEIN: 7.2 g/dL (ref 6.0–8.3)
Total Bilirubin: 0.7 mg/dL (ref 0.2–1.2)

## 2017-10-22 LAB — CBC
HCT: 37.9 % (ref 36.0–46.0)
Hemoglobin: 12.8 g/dL (ref 12.0–15.0)
MCHC: 33.8 g/dL (ref 30.0–36.0)
MCV: 76.9 fl — ABNORMAL LOW (ref 78.0–100.0)
Platelets: 358 10*3/uL (ref 150.0–400.0)
RBC: 4.93 Mil/uL (ref 3.87–5.11)
RDW: 14.8 % (ref 11.5–15.5)
WBC: 10.9 10*3/uL — AB (ref 4.0–10.5)

## 2017-10-22 LAB — HEMOGLOBIN A1C: HEMOGLOBIN A1C: 5.3 % (ref 4.6–6.5)

## 2017-10-22 LAB — LIPID PANEL
CHOLESTEROL: 173 mg/dL (ref 0–200)
HDL: 45.5 mg/dL (ref >39.00)
LDL CALC: 110 mg/dL — AB (ref 0–99)
NonHDL: 127.72
TRIGLYCERIDES: 89 mg/dL (ref 0.0–149.0)
Total CHOL/HDL Ratio: 4
VLDL: 17.8 mg/dL (ref 0.0–40.0)

## 2017-10-22 LAB — TSH: TSH: 1.34 u[IU]/mL (ref 0.35–4.50)

## 2017-10-22 NOTE — Progress Notes (Signed)
Chief Complaint  Patient presents with  . New Patient (Initial Visit)     Well Woman Stacey Conner is here for a complete physical.   Her last physical was >1 year ago.  Current diet: in general, a "healthy" diet. Current exercise: some walking. Weight is stable and she confirms daytime fatigue. No LMP recorded. Seatbelt? Yes  Health Maintenance Pap/HPV- Yes Mammogram- No Tetanus- No HIV screening- No  Past Medical History:  Diagnosis Date  . Allergy   . Frequent headaches   . Migraine   . Thyroid disease      Past Surgical History:  Procedure Laterality Date  . ECTOPIC PREGNANCY SURGERY  2016  . TONSILLECTOMY  1984    Medications  Takes no meds routinely.  Allergies Allergies  Allergen Reactions  . Sulfa Antibiotics     Review of Systems: Constitutional:  no unexpected weight changes Eye:  no recent significant change in vision Ear/Nose/Mouth/Throat:  Ears:  no tinnitus or vertigo and no recent change in hearing Nose/Mouth/Throat:  no complaints of nasal congestion, no sore throat Cardiovascular: no chest pain Respiratory:  no cough and no shortness of breath Gastrointestinal:  no abdominal pain, no change in bowel habits GU:  Female: negative for dysuria or pelvic pain Musculoskeletal/Extremities:  no pain of the joints Integumentary (Skin/Breast):  no abnormal skin lesions reported Neurologic:  no headaches Endocrine:  denies fatigue Hematologic/Lymphatic:  No areas of easy bleeding  Exam BP 128/80 (BP Location: Left Arm, Patient Position: Sitting, Cuff Size: Normal)   Pulse 89   Temp 98.2 F (36.8 C) (Oral)   Ht 5\' 4"  (1.626 m)   Wt 197 lb 2 oz (89.4 kg)   SpO2 96%   BMI 33.84 kg/m  General:  well developed, well nourished, in no apparent distress Skin:  no significant moles, warts, or growths Head:  no masses, lesions, or tenderness Eyes:  pupils equal and round, sclera anicteric without injection Ears:  canals without lesions, TMs shiny  without retraction, no obvious effusion, no erythema Nose:  nares patent, septum midline, mucosa normal, and no drainage or sinus tenderness Throat/Pharynx:  lips and gingiva without lesion; tongue and uvula midline; non-inflamed pharynx; no exudates or postnasal drainage Neck: neck supple without adenopathy, thyromegaly, or masses Lungs:  clear to auscultation, breath sounds equal bilaterally, no respiratory distress Cardio:  regular rate and rhythm, no bruits, no LE edema Abdomen:  abdomen soft, nontender; bowel sounds normal; no masses or organomegaly Genital: Defer to GYN Musculoskeletal:  symmetrical muscle groups noted without atrophy or deformity Extremities:  no clubbing, cyanosis, or edema, no deformities, no skin discoloration Neuro:  gait normal; deep tendon reflexes normal and symmetric Psych: well oriented with normal range of affect and appropriate judgment/insight  Assessment and Plan  Well adult exam - Plan: TSH, Hemoglobin A1c, Comprehensive metabolic panel, CBC, Lipid panel  Screening for malignant neoplasm of breast - Plan: MM DIGITAL SCREENING BILATERAL  Screening for HIV (human immunodeficiency virus) - Plan: HIV Antibody (routine testing w rflx)  Need for influenza vaccination - Plan: Flu Vaccine QUAD 6+ mos PF IM (Fluarix Quad PF)  Need for tetanus booster - Plan: Tdap vaccine greater than or equal to 7yo IM   Well 42 y.o. female. Counseled on diet and exercise. Had some night sweats, likely GERD, but will ck CBC and A1c. Mind temp and clothing at night. Not likely premenopause, infection, hyperhidrosis. Other orders as above. Follow up in 1 yr or prn. The patient voiced understanding and  agreement to the plan.  Jilda Roche [place], DO 10/22/17 11:21 AM

## 2017-10-22 NOTE — Progress Notes (Signed)
Pre visit review using our clinic review tool, if applicable. No additional management support is needed unless otherwise documented below in the visit note. 

## 2017-10-22 NOTE — Patient Instructions (Addendum)
Give [place] 2-3 business days to get the results of your labs back.   Aim to do some physical exertion for 150 minutes per week. This is typically divided into 5 days per week, 30 minutes per day. The activity should be enough to get your heart rate up. Anything is better than nothing if you have time constraints.  Keep the diet clean.   The only lifestyle changes that have data behind them are weight loss for the overweight/obese and elevating the head of the bed. Finding out which foods/positions are triggers is important.  Consider Pepcid 20 mg twice daily for reflux. Omeprazole (Prilosec) 20 mg twice daily is an alternative.  Let us know if you need anything.

## 2017-10-23 ENCOUNTER — Encounter: Payer: Self-pay | Admitting: Family Medicine

## 2017-10-23 LAB — HIV ANTIBODY (ROUTINE TESTING W REFLEX): HIV: NONREACTIVE

## 2017-11-04 ENCOUNTER — Ambulatory Visit (HOSPITAL_BASED_OUTPATIENT_CLINIC_OR_DEPARTMENT_OTHER): Payer: 59

## 2018-02-15 ENCOUNTER — Other Ambulatory Visit (HOSPITAL_BASED_OUTPATIENT_CLINIC_OR_DEPARTMENT_OTHER): Payer: Self-pay | Admitting: Family Medicine

## 2018-02-15 DIAGNOSIS — Z1231 Encounter for screening mammogram for malignant neoplasm of breast: Secondary | ICD-10-CM

## 2018-02-24 ENCOUNTER — Ambulatory Visit (INDEPENDENT_AMBULATORY_CARE_PROVIDER_SITE_OTHER): Payer: 59 | Admitting: Family Medicine

## 2018-02-24 ENCOUNTER — Encounter: Payer: Self-pay | Admitting: Family Medicine

## 2018-02-24 VITALS — BP 122/84 | HR 84 | Temp 99.3°F | Ht 64.0 in | Wt 188.1 lb

## 2018-02-24 DIAGNOSIS — F329 Major depressive disorder, single episode, unspecified: Secondary | ICD-10-CM

## 2018-02-24 DIAGNOSIS — M25551 Pain in right hip: Secondary | ICD-10-CM | POA: Diagnosis not present

## 2018-02-24 DIAGNOSIS — F419 Anxiety disorder, unspecified: Secondary | ICD-10-CM

## 2018-02-24 DIAGNOSIS — M25511 Pain in right shoulder: Secondary | ICD-10-CM | POA: Diagnosis not present

## 2018-02-24 DIAGNOSIS — F32A Depression, unspecified: Secondary | ICD-10-CM | POA: Insufficient documentation

## 2018-02-24 DIAGNOSIS — J309 Allergic rhinitis, unspecified: Secondary | ICD-10-CM

## 2018-02-24 MED ORDER — METHYLPREDNISOLONE ACETATE 80 MG/ML IJ SUSP
80.0000 mg | Freq: Once | INTRAMUSCULAR | Status: AC
  Administered 2018-02-24: 80 mg via INTRAMUSCULAR

## 2018-02-24 NOTE — Patient Instructions (Addendum)
Please consider counseling. Contact [phone] to schedule an appointment or inquire about cost/insurance coverage.  Send me a message over weekend if you are not improving and we will call something else in.   Ibuprofen 400-600 mg (2-3 over the counter strength tabs) every 6 hours as needed for pain.  OK to take Tylenol 1000 mg (2 extra strength tabs) or 975 mg (3 regular strength tabs) every 6 hours as needed.  Heat (pad or rice pillow in microwave) over affected area, 10-15 minutes twice daily.   Ice/cold pack over area for 10-15 min twice daily.  Coping skills Choose 5 that work for you:  Take a deep breath  Count to 20  Read a book  Do a puzzle  Meditate  Bake  Sing  Knit  Garden  Pray  Go outside  Call a friend  Listen to music  Take a walk  Color  Send a note  Take a bath  Watch a movie  Be alone in a quiet place  Pet an animal  Visit a friend  Journal  Exercise  Stretch   Aim to do some physical exertion for 150 minutes per week. This is typically divided into 5 days per week, 30 minutes per day. The activity should be enough to get your heart rate up. Anything is better than nothing if you have time constraints.   Biceps Tendon Disruption (Proximal) Rehab Do exercises exactly as told by your health care provider and adjust them as directed. It is normal to feel mild stretching, pulling, tightness, or discomfort as you do these exercises, but you should stop right away if you feel sudden pain or your pain gets worse. Stretching and range of motion exercises These exercises warm up your muscles and joints and improve the movement and flexibility of your arm and shoulder. These exercises also help to relieve pain and stiffness. Exercise A: Shoulder flexion, standing   1. Stand facing a wall. Put your left / right hand on the wall. 2. Slide your left / right hand up the wall. Stop when you feel a stretch in your shoulder, or when you  reach the angle recommended by your health care provider.  Use your other hand to help raise your arm, if needed.  As your hand gets higher, you may need to step closer to the wall.  Avoid shrugging your shoulder while you raise your arm. To do this, keep your shoulder blade tucked down toward your spine. 3. Hold for 30 seconds. 4. Slowly return to the starting position. Use your other arm to help, if needed. Repeat 2 times. Complete this exercise 3 times per week. Exercise B: Pendulum   1. Stand near a wall or a surface that you can hold onto for balance. 2. Bend at the waist and let your left / right arm hang straight down. Use your other arm to support you. 3. Relax your arm and shoulder muscles, and move your hips and your trunk so your left / right arm swings freely. Your arm should swing because of the motion of your body, not because you are using your arm or shoulder muscles. 4. Keep moving so your arm swings in the following directions, as told by your health care provider:  Side to side.  Forward and backward.  In clockwise and counterclockwise circles. 5. Slowly return to the starting position. Repeat 2 times. Complete this exercise 3 times per week.  Strengthening exercises These exercises build strength and endurance in your  arm and shoulder. Endurance is the ability to use your muscles for a long time, even after your muscles get tired. Exercise C: Elbow flexion, neutral  1. Sit on a stable chair without armrests, or stand. 2. Hold a 3-5 lb weight in your left / right hand, or hold an exercise band with both hands. Your palms should face each other at the starting position. 3. Bend your left / right elbow and move your hand up toward your shoulder.  Lead with your thumb, and keep your palm facing the same direction.  Keep your other arm straight down, in the starting position. 4. Slowly return to the starting position. Repeat 2-3 times. Complete this exercise 3 times  per week. Exercise D: Forearm supination   1. Sit with your left / right forearm on a table. Your elbow should be below shoulder height. Rest your hand over the edge of the table so your palm faces down. 2. If directed, hold a hammer with your left / right hand. 3. Without moving your elbow, slowly rotate your hand so your palm faces up toward the ceiling.  If you are holding a hammer, begin by holding the hammer near the head. When this exercise gets easier for you, hold the hammer farther down the handle. 4. Hold for 3 seconds. 5. Slowly return to the starting position. Repeat 2 times. Complete this exercise 3 times per week. Exercise E: Scapular retraction   1. Sit in a stable chair without armrests, or stand. 2. Secure an exercise band to a stable object in front of you so the band is at shoulder height. 3. Hold one end of the exercise band in each hand. 4. Squeeze your shoulder blades together and move your elbows slightly behind you. Do not shrug your shoulders. 5. Hold for 3 seconds. 6. Slowly return to the starting position. Repeat 2 times. Complete this exercise 3 times per week. Exercise F: Scapular protraction, supine   1. Lie on your back on a firm surface. Hold a 3-5 lb weight in your left / right hand. 2. Raise your left / right arm straight into the air so your hand is directly above your shoulder joint. 3. Push the weight into the air so your shoulder lifts off of the surface that you are lying on. Do not move your head, neck, or back. 4. Hold for 3 seconds. 5. Slowly return to the starting position. Let your muscles relax completely before you repeat this exercise. Repeat 2 times. Complete this exercise 3 times per week. This information is not intended to replace advice given to you by your health care provider. Make sure you discuss any questions you have with your health care provider. Document Released: 12/22/2004 Document Revised: 08/29/2015 Document Reviewed:  11/30/2014 Elsevier Interactive Patient Education  2017 [employer]   Hip Exercises It is normal to feel mild stretching, pulling, tightness, or discomfort as you do these exercises, but you should stop right away if you feel sudden pain or your pain gets worse.  STRETCHING AND RANGE OF MOTION EXERCISES These exercises warm up your muscles and joints and improve the movement and flexibility of your hip. These exercises also help to relieve pain, numbness, and tingling. Exercise A: Hamstrings, Supine  1. Lie on your back. 2. Loop a belt or towel over the ball of your left / rightfoot. The ball of your foot is on the walking surface, right under your toes. 3. Straighten your left / rightknee and slowly pull  on the belt to raise your leg. ? Do not let your left / right knee bend while you do this. ? Keep your other leg flat on the floor. ? Raise the left / right leg until you feel a gentle stretch behind your left / right knee or thigh. 4. Hold this position for 30 seconds. 5. Slowly return your leg to the starting position. Repeat2 times. Complete this stretch 3 times per week. Exercise B: Hip Rotators  1. Lie on your back on a firm surface. 2. Hold your left / right knee with your left / right hand. Hold your ankle with your other hand. 3. Gently pull your left / right knee and rotate your lower leg toward your other shoulder. ? Pull until you feel a stretch in your buttocks. ? Keep your hips and shoulders firmly planted while you do this stretch. 4. Hold this position for 30 seconds. Repeat 2 times. Complete this stretch 3 times per week. Exercise C: V-Sit (Hamstrings and Adductors)  1. Sit on the floor with your legs extended in a large "V" shape. Keep your knees straight during this exercise. 2. Start with your head and chest upright, then bend at your waist to reach for your left foot (position A). You should feel a stretch in your right inner thigh. 3. Hold this position  for 30 seconds. Then slowly return to the upright position. 4. Bend at your waist to reach forward (position B). You should feel a stretch behind both of your thighs and knees. 5. Hold this position for 30 seconds. Then slowly return to the upright position. 6. Bend at your waist to reach for your right foot (position C). You should feel a stretch in your left inner thigh. 7. Hold this position for 30 seconds. Then slowly return to the upright position. Repeat A, B, and C 2 times each. Complete this stretch 3 times per week. Exercise D: Lunge (Hip Flexors)  1. Place your left / right knee on the floor and bend your other knee so that is directly over your ankle. You should be half-kneeling. 2. Keep good posture with your head over your shoulders. 3. Tighten your buttocks to point your tailbone downward. This helps your back to keep from arching too much. 4. You should feel a gentle stretch in the front of your left / right thigh and hip. If you do not feel any resistance, slightly slide your other foot forward and then slowly lunge forward so your knee once again lines up over your ankle. 5. Make sure your tailbone continues to point downward. 6. Hold this position for 30 seconds. Repeat 2 times. Complete this stretch 3 times per week.  STRENGTHENING EXERCISES These exercises build strength and endurance in your hip. Endurance is the ability to use your muscles for a long time, even after they get tired. Exercise E: Bridge (Hip Extensors)  1. Lie on your back on a firm surface with your knees bent and your feet flat on the floor. 2. Tighten your buttocks muscles and lift your bottom off the floor until the trunk of your body is level with your thighs. ? Do not arch your back. ? You should feel the muscles working in your buttocks and the back of your thighs. If you do not feel these muscles, slide your feet 1-2 inches (2.5-5 cm) farther away from your buttocks. 3. Hold this position for 3  seconds. 4. Slowly lower your hips to the starting position. Repeat for  a total of 10 repetitions. 5. Let your muscles relax completely between repetitions. 6. If this exercise is too easy, try doing it with your arms crossed over your chest. Repeat 2 times. Complete this exercise 3 times per week. Exercise F: Straight Leg Raises - Hip Abductors  1. Lie on your side with your left / right leg in the top position. Lie so your head, shoulder, knee, and hip line up with each other. You may bend your bottom knee to help you balance. 2. Roll your hips slightly forward, so your hips are stacked directly over each other and your left / right knee is facing forward. 3. Leading with your heel, lift your top leg 4-6 inches (10-15 cm). You should feel the muscles in your outer hip lifting. ? Do not let your foot drift forward. ? Do not let your knee roll toward the ceiling. 4. Hold this position for 1 second. 5. Slowly return to the starting position. 6. Let your muscles relax completely between repetitions. Repeat for a total of 10 repetitions.  Repeat 2 times. Complete this exercise 3 times per week. Exercise G: Straight Leg Raises - Hip Adductors  1. Lie on your side with your left / right leg in the bottom position. Lie so your head, shoulder, knee, and hip line up. You may place your upper foot in front to help you balance. 2. Roll your hips slightly forward, so your hips are stacked directly over each other and your left / right knee is facing forward. 3. Tense the muscles in your inner thigh and lift your bottom leg 4-6 inches (10-15 cm). 4. Hold this position for 1 second. 5. Slowly return to the starting position. 6. Let your muscles relax completely between repetitions. Repeat for a total of 10 repetitions. Repeat 2 times. Complete this exercise 3 times per week. Exercise H: Straight Leg Raises - Quadriceps  1. Lie on your back with your left / right leg extended and your other knee  bent. 2. Tense the muscles in the front of your left / right thigh. When you do this, you should see your kneecap slide up or see increased dimpling just above your knee. 3. Tighten these muscles even more and raise your leg 4-6 inches (10-15 cm) off the floor. 4. Hold this position for 3 seconds. 5. Keep these muscles tense as you lower your leg. 6. Relax the muscles slowly and completely between repetitions. Repeat for a total of 10 repetitions. Repeat 2 times. Complete this exercise 3 times per week. Exercise I: Hip Abductors, Standing 1. Tie one end of a rubber exercise band or tubing to a secure surface, such as a table or pole. 2. Loop the other end of the band or tubing around your left / right ankle. 3. Keeping your ankle with the band or tubing directly opposite of the secured end, step away until there is tension in the tubing or band. Hold onto a chair as needed for balance. 4. Lift your left / right leg out to your side. While you do this: ? Keep your back upright. ? Keep your shoulders over your hips. ? Keep your toes pointing forward. ? Make sure to use your hip muscles to lift your leg. Do not "throw" your leg or tip your body to lift your leg. 5. Hold this position for 1 second. 6. Slowly return to the starting position. Repeat for a total of 10 repetitions. Repeat 2 times. Complete this exercise 3 times per  week. Exercise J: Squats (Quadriceps) 1. Stand in a door frame so your feet and knees are in line with the frame. You may place your hands on the frame for balance. 2. Slowly bend your knees and lower your hips like you are going to sit in a chair. ? Keep your lower legs in a straight-up-and-down position. ? Do not let your hips go lower than your knees. ? Do not bend your knees lower than told by your health care provider. ? If your hip pain increases, do not bend as low. 3. Hold this position for 1 second. 4. Slowly push with your legs to return to standing. Do not  use your hands to pull yourself to standing. Repeat for a total of 10 repetitions. Repeat 2 times. Complete this exercise 3 times per week. Make sure you discuss any questions you have with your health care provider. Document Released: 01/09/2005 Document Revised: 09/16/2015 Document Reviewed: 12/17/2014 Elsevier Interactive Patient Education  [employer].

## 2018-02-24 NOTE — Progress Notes (Signed)
Chief Complaint  Patient presents with  . Depression  . Anxiety  . Hip Pain    right  . Shoulder Pain  . Cough   Subjective Stacey Conner is an 43 y.o. female who presents with anxiety/depression Symptoms began around 4 mo ago.  Experiencing depression, anxiety, anhedonia, guilt, poor concentration, low energy. Family history significant for mom and dad with anxiety/depression. Social stressors include just breaking up with boyfriend. She is not currently on any medication.  She has been on Lexapro in the past and did well.  She does not wish to go on any medicine at this time. She is not following with a psychologist/counselor.  Duration: 2.5 weeks  Associated symptoms: sinus congestion, rhinorrhea, itchy watery eyes, ear fullness, sore throat and cough Denies: sinus pain, fevers, ear drainage, wheezing, shortness of breath and myalgia Treatment to date: Robitussin, cough drops Sick contacts: Yes-living with the family who his daughter is ill  Over the past month and a half, the patient has been having right shoulder and right hip pain.  She does have a family history of arthritis.  No injury or change in activity.  Has not tried anything at home so far.  Past Medical History:  Diagnosis Date  . Allergy   . Frequent headaches   . Migraine   . Thyroid disease     Medications Takes no meds routinely.  Allergies Allergies  Allergen Reactions  . Sulfa Antibiotics     Family History Family History  Problem Relation Age of Onset  . Arthritis Mother   . Depression Mother   . Hyperlipidemia Mother   . Hypertension Mother   . Arthritis Father   . Diabetes Father   . Depression Father   . Asthma Son      Review Of Systems Cardiovascular:  no chest pain, no palpitations Psychiatric: as noted in HPI  Exam BP 122/84 (BP Location: Left Arm, Patient Position: Sitting, Cuff Size: Normal)   Pulse 84   Temp 99.3 F (37.4 C) (Oral)   Ht 5\' 4"  (1.626 m)   Wt 188 lb 2 oz  (85.3 kg)   SpO2 96%   BMI 32.29 kg/m  General:  well developed, well nourished, in no apparent distress Neck: neck supple without adenopathy, thyromegaly, or masses Lungs:  clear to auscultation, breath sounds equal bilaterally, normal respiratory effort without accessory muscle use Cardio:  regular rate and rhythm without murmurs MSK: R shoulder- +TTP over coracoid process, nml ROM, neg Neer's, Speed's, Yergason's, lift off, cross over R hip: +TTP over greater troch bursa, neg Ober's, FABER, FADDIR, Stinchfield, logroll Neuro:  deep tendon reflexes normal and symmetric and no cerebellar signs or ataxia noted Psych: well oriented with normal range of affect and age-appropriate judgement/insight  Assessment and Plan  Anxiety and depression  Pain of right hip joint  Acute pain of right shoulder  Allergic rhinitis, unspecified seasonality, unspecified trigger - Plan: methylPREDNISolone acetate (DEPO-MEDROL) injection 80 mg  Orders as above. Counseled on adjunctive treatment with exercise/physical activity. Number for [facility] provided in AVS. Depo-Medrol injection today, if no improvement will call in an antibiotic. Stretches and exercises given for both.  She deftly has trochanteric bursitis but will trial stretches and exercises first.  If no improvement, will consider injection. F/u in 1 mo. Patient voiced understanding and agreement to the plan.  Jilda Roche Dewar, DO 02/24/18 3:16 PM

## 2018-02-25 ENCOUNTER — Ambulatory Visit (HOSPITAL_BASED_OUTPATIENT_CLINIC_OR_DEPARTMENT_OTHER)
Admission: RE | Admit: 2018-02-25 | Discharge: 2018-02-25 | Disposition: A | Discharge status: Home / Self Care | Payer: 59 | Source: Ambulatory Visit | Attending: Family Medicine | Admitting: Family Medicine

## 2018-02-25 ENCOUNTER — Encounter (HOSPITAL_BASED_OUTPATIENT_CLINIC_OR_DEPARTMENT_OTHER): Payer: Self-pay | Admitting: Radiology

## 2018-02-25 DIAGNOSIS — Z1231 Encounter for screening mammogram for malignant neoplasm of breast: Secondary | ICD-10-CM | POA: Diagnosis not present

## 2018-02-28 ENCOUNTER — Encounter: Payer: Self-pay | Admitting: Family Medicine

## 2018-02-28 MED ORDER — BENZONATATE 100 MG PO CAPS: 100.0000 mg | ORAL_CAPSULE | Freq: Three times a day (TID) | ORAL | 0 refills | Status: DC | PRN

## 2018-03-08 ENCOUNTER — Encounter: Payer: Self-pay | Admitting: Family Medicine

## 2018-03-08 ENCOUNTER — Other Ambulatory Visit: Payer: Self-pay | Admitting: Family Medicine

## 2018-03-08 MED ORDER — PROMETHAZINE-CODEINE 6.25-10 MG/5ML PO SOLN: 5.0000 mL | Freq: Four times a day (QID) | ORAL | 0 refills | Status: DC | PRN

## 2018-03-10 ENCOUNTER — Encounter: Payer: Self-pay | Admitting: Family Medicine

## 2018-03-10 ENCOUNTER — Ambulatory Visit (INDEPENDENT_AMBULATORY_CARE_PROVIDER_SITE_OTHER): Payer: 59 | Admitting: Family Medicine

## 2018-03-10 VITALS — BP 131/99 | HR 85 | Ht 64.0 in | Wt 189.0 lb

## 2018-03-10 DIAGNOSIS — Z01419 Encounter for gynecological examination (general) (routine) without abnormal findings: Secondary | ICD-10-CM

## 2018-03-10 DIAGNOSIS — Z3202 Encounter for pregnancy test, result negative: Secondary | ICD-10-CM | POA: Diagnosis not present

## 2018-03-10 DIAGNOSIS — Z113 Encounter for screening for infections with a predominantly sexual mode of transmission: Secondary | ICD-10-CM | POA: Diagnosis not present

## 2018-03-10 DIAGNOSIS — Z124 Encounter for screening for malignant neoplasm of cervix: Secondary | ICD-10-CM

## 2018-03-10 DIAGNOSIS — Z30430 Encounter for insertion of intrauterine contraceptive device: Secondary | ICD-10-CM | POA: Diagnosis not present

## 2018-03-10 DIAGNOSIS — Z1151 Encounter for screening for human papillomavirus (HPV): Secondary | ICD-10-CM

## 2018-03-10 LAB — POCT URINE PREGNANCY: Preg Test, Ur: NEGATIVE

## 2018-03-10 MED ORDER — LEVONORGESTREL 19.5 MCG/DAY IU IUD
INTRAUTERINE_SYSTEM | Freq: Once | INTRAUTERINE | Status: AC
  Administered 2018-03-10: 1 via INTRAUTERINE

## 2018-03-10 NOTE — Progress Notes (Signed)
IUD Procedure Note Patient identified, informed consent performed, signed copy in chart, time out was performed.  Urine pregnancy test negative.  Speculum placed in the vagina.  Cervix visualized.  Cleaned with Betadine x 2.  Grasped anteriorly with a single tooth tenaculum.  Uterus sounded to 9 cm.  Liletta  IUD placed per manufacturer's recommendations.  Strings trimmed to 3 cm. Tenaculum was removed, good hemostasis noted.  Patient tolerated procedure well.   Patient given post procedure instructions and Liletta care card with expiration date.  Patient is asked to check IUD strings periodically and follow up in 4-6 weeks for IUD check.  

## 2018-03-10 NOTE — Patient Instructions (Signed)

## 2018-03-10 NOTE — Addendum Note (Signed)
Addended by: Anell Barr on: 03/10/2018 10:59 AM   Modules accepted: Orders

## 2018-03-10 NOTE — Progress Notes (Signed)
GYNECOLOGY ANNUAL PREVENTATIVE CARE ENCOUNTER NOTE  Subjective:   Stacey Conner is a 43 y.o. [phone] female here for a routine annual gynecologic exam.  Current complaints: heavy and painful periods. Has regular period interval of about 28 days, every other month period is heavy and with bleeding.   Denies abnormal vaginal bleeding, discharge, pelvic pain, problems with intercourse or other gynecologic concerns.    Has unilateral salpingectomy for ectopic.   Gynecologic History Patient's last menstrual period was 03/09/2018 (exact date). Patient is sexually active  Contraception: none Last Pap: 2013. Results were: normal. Has history of abnormal PAP and LEEP in 2012. Last mammogram: 2020. Results were: normal  Obstetric History OB History  Gravida Para Term Preterm AB Living  4 1 1   3 1   SAB TAB Ectopic Multiple Live Births  1   2   1     # Outcome Date GA Lbr Len/2nd Weight Sex Delivery Anes PTL Lv  4 Ectopic 2016             Birth Comments: Left tube removed  3 SAB 2011          2 Ectopic 2008          1 Term 08/16/97 [redacted]w[redacted]d    Vag-Spont       Past Medical History:  Diagnosis Date  . Allergy   . Frequent headaches   . Migraine   . Thyroid disease   . Vaginal Pap smear, abnormal 2006    Past Surgical History:  Procedure Laterality Date  . ECTOPIC PREGNANCY SURGERY  2016  . LEEP  2006  . TONSILLECTOMY  1984    Current Outpatient Medications on File Prior to Visit  Medication Sig Dispense Refill  . Promethazine-Codeine 6.25-10 MG/5ML SOLN Take 5 mLs by mouth every 6 (six) hours as needed. 1 Bottle 0   No current facility-administered medications on file prior to visit.     Allergies  Allergen Reactions  . Sulfa Antibiotics     Social History   Socioeconomic History  . Marital status: Single    Spouse name: Not on file  . Number of children: Not on file  . Years of education: Not on file  . Highest education level: Not on file  Occupational History    . Not on file  Social Needs  . Financial resource strain: Not on file  . Food insecurity:    Worry: Not on file    Inability: Not on file  . Transportation needs:    Medical: Not on file    Non-medical: Not on file  Tobacco Use  . Smoking status: Current Every Day Smoker  . Smokeless tobacco: Never Used  Substance and Sexual Activity  . Alcohol use: Yes    Comment: moderate  . Drug use: Never  . Sexual activity: Yes    Birth control/protection: None  Lifestyle  . Physical activity:    Days per week: Not on file    Minutes per session: Not on file  . Stress: Not on file  Relationships  . Social connections:    Talks on phone: Not on file    Gets together: Not on file    Attends religious service: Not on file    Active member of club or organization: Not on file    Attends meetings of clubs or organizations: Not on file    Relationship status: Not on file  . Intimate partner violence:    Fear of current or  ex partner: Not on file    Emotionally abused: Not on file    Physically abused: Not on file    Forced sexual activity: Not on file  Other Topics Concern  . Not on file  Social History Narrative  . Not on file    Family History  Problem Relation Age of Onset  . Arthritis Mother   . Depression Mother   . Hyperlipidemia Mother   . Hypertension Mother   . Arthritis Father   . Diabetes Father   . Depression Father   . Asthma Son     The following portions of the patient's history were reviewed and updated as appropriate: allergies, current medications, past family history, past medical history, past social history, past surgical history and problem list.  Review of Systems Pertinent items are noted in HPI.   Objective:  BP (!) 131/99   Pulse 85   Ht 5\' 4"  (1.626 m)   Wt 189 lb (85.7 kg)   LMP 03/09/2018 (Exact Date)   BMI 32.44 kg/m  Wt Readings from Last 3 Encounters:  03/10/18 189 lb (85.7 kg)  02/24/18 188 lb 2 oz (85.3 kg)  10/22/17 197 lb 2 oz  (89.4 kg)     CONSTITUTIONAL: Well-developed, well-nourished female in no acute distress.  HENT:  Normocephalic, atraumatic, External right and left ear normal. Oropharynx is clear and moist EYES: Conjunctivae and EOM are normal. Pupils are equal, round, and reactive to light. No scleral icterus.  NECK: Normal range of motion, supple, no masses.  Normal thyroid.   CARDIOVASCULAR: Normal heart rate noted, regular rhythm RESPIRATORY: Clear to auscultation bilaterally. Effort and breath sounds normal, no problems with respiration noted. BREASTS: Symmetric in size. No masses, skin changes, nipple drainage, or lymphadenopathy. ABDOMEN: Soft, normal bowel sounds, no distention noted.  No tenderness, rebound or guarding.  PELVIC: Normal appearing external genitalia; normal appearing vaginal mucosa and cervix.  No abnormal discharge noted.  Normal uterine size, no other palpable masses, no uterine or adnexal tenderness. MUSCULOSKELETAL: Normal range of motion. No tenderness.  No cyanosis, clubbing, or edema.  2+ distal pulses. SKIN: Skin is warm and dry. No rash noted. Not diaphoretic. No erythema. No pallor. NEUROLOGIC: Alert and oriented to person, place, and time. Normal reflexes, muscle tone coordination. No cranial nerve deficit noted. PSYCHIATRIC: Normal mood and affect. Normal behavior. Normal judgment and thought content.  Assessment:  Annual gynecologic examination with pap smear   Plan:  1. Well Woman Exam Will follow up results of pap smear and manage accordingly. STD testing discussed. Patient requested testing - Cytology - PAP( [place])  2. Encounter for IUD insertion IUD inserted. See separate procedure note.   Routine preventative health maintenance measures emphasized. Please refer to After Visit Summary for other counseling recommendations.    Candelaria Celeste, DO [facility]

## 2018-03-10 NOTE — Progress Notes (Signed)
Patient thinking about IUD. Armandina Stammer RN

## 2018-03-11 LAB — CYTOLOGY - PAP
CHLAMYDIA, DNA PROBE: NEGATIVE
DIAGNOSIS: NEGATIVE
HPV: NOT DETECTED
Neisseria Gonorrhea: NEGATIVE

## 2018-03-24 ENCOUNTER — Encounter: Payer: Self-pay | Admitting: Family Medicine

## 2018-03-24 ENCOUNTER — Other Ambulatory Visit: Payer: Self-pay

## 2018-03-24 ENCOUNTER — Ambulatory Visit (INDEPENDENT_AMBULATORY_CARE_PROVIDER_SITE_OTHER): Payer: 59 | Admitting: Family Medicine

## 2018-03-24 VITALS — BP 118/78 | HR 75 | Temp 99.1°F | Ht 64.0 in | Wt 190.4 lb

## 2018-03-24 DIAGNOSIS — F32A Depression, unspecified: Secondary | ICD-10-CM

## 2018-03-24 DIAGNOSIS — F419 Anxiety disorder, unspecified: Secondary | ICD-10-CM

## 2018-03-24 DIAGNOSIS — M25511 Pain in right shoulder: Secondary | ICD-10-CM | POA: Diagnosis not present

## 2018-03-24 DIAGNOSIS — F329 Major depressive disorder, single episode, unspecified: Secondary | ICD-10-CM

## 2018-03-24 DIAGNOSIS — M25551 Pain in right hip: Secondary | ICD-10-CM | POA: Diagnosis not present

## 2018-03-24 MED ORDER — NAPROXEN 500 MG PO TBEC: 500.0000 mg | DELAYED_RELEASE_TABLET | Freq: Two times a day (BID) | ORAL | 1 refills | Status: DC | PRN

## 2018-03-24 MED ORDER — OMEPRAZOLE 20 MG PO CPDR: 20.0000 mg | DELAYED_RELEASE_CAPSULE | Freq: Every day | ORAL | 1 refills | Status: DC

## 2018-03-24 NOTE — Progress Notes (Signed)
Chief Complaint  Patient presents with  . Hip Pain  . Shoulder Pain    Subjective: Patient is a 43 y.o. female here for f/u.  Tx'd with stretches/exercises for bursitis and shoulder pain. Reports about 80% improvement. Pleased with where she is at. Was compliant with HEP.   Doing well on omeprazole 20 mg/d, would like rx for this if possible.    ROS: Heart: Denies chest pain  Lungs: Denies SOB   Past Medical History:  Diagnosis Date  . Allergy   . Frequent headaches   . Migraine   . Thyroid disease   . Vaginal Pap smear, abnormal 2006    Objective: BP 118/78 (BP Location: Left Arm, Patient Position: Sitting, Cuff Size: Large)   Pulse 75   Temp 99.1 F (37.3 C) (Oral)   Ht 5\' 4"  (1.626 m)   Wt 190 lb 6 oz (86.4 kg)   LMP 03/09/2018 (Exact Date)   SpO2 97%   BMI 32.68 kg/m  General: Awake, appears stated age MSK: No ttp over R shoulder, good ROM, neg Neer's, lift off, cross over, Hawkins; no ttp over the greater troch bursa Lungs: No accessory muscle use Psych: Age appropriate judgment and insight, normal affect and mood  Assessment and Plan: Anxiety and depression  Pain of right hip joint  Acute pain of right shoulder  Doing well. May need to do maintenance HEP 1-2x/week.  F/u in 7 mo for CPE. The patient voiced understanding and agreement to the plan.  Jilda Roche [place], DO 03/24/18  10:26 AM

## 2018-03-24 NOTE — Patient Instructions (Signed)
Keep up the good work.  The stretches/exercises you can do 1-2 times per week.  Let us know if you need anything.

## 2018-04-07 ENCOUNTER — Ambulatory Visit: Payer: 59 | Admitting: Obstetrics & Gynecology

## 2018-05-03 ENCOUNTER — Other Ambulatory Visit: Payer: Self-pay

## 2018-05-03 DIAGNOSIS — B9689 Other specified bacterial agents as the cause of diseases classified elsewhere: Secondary | ICD-10-CM

## 2018-05-03 DIAGNOSIS — B379 Candidiasis, unspecified: Secondary | ICD-10-CM

## 2018-05-03 DIAGNOSIS — N760 Acute vaginitis: Secondary | ICD-10-CM

## 2018-05-03 MED ORDER — METRONIDAZOLE 500 MG PO TABS: 500.0000 mg | ORAL_TABLET | Freq: Two times a day (BID) | ORAL | 0 refills | Status: DC

## 2018-05-03 MED ORDER — FLUCONAZOLE 150 MG PO TABS: 150.0000 mg | ORAL_TABLET | Freq: Once | ORAL | 0 refills | Status: AC

## 2018-08-29 ENCOUNTER — Encounter: Payer: Self-pay | Admitting: Family Medicine

## 2018-08-29 MED ORDER — NAPROXEN 500 MG PO TBEC: 500.0000 mg | DELAYED_RELEASE_TABLET | Freq: Two times a day (BID) | ORAL | 1 refills | Status: DC | PRN

## 2018-10-24 ENCOUNTER — Encounter: Payer: Self-pay | Admitting: Family Medicine

## 2018-10-24 ENCOUNTER — Other Ambulatory Visit: Payer: Self-pay

## 2018-10-24 ENCOUNTER — Ambulatory Visit (INDEPENDENT_AMBULATORY_CARE_PROVIDER_SITE_OTHER): Payer: 59 | Admitting: Family Medicine

## 2018-10-24 VITALS — BP 110/76 | HR 94 | Temp 98.1°F | Ht 64.0 in | Wt 190.4 lb

## 2018-10-24 DIAGNOSIS — Z0000 Encounter for general adult medical examination without abnormal findings: Secondary | ICD-10-CM | POA: Diagnosis not present

## 2018-10-24 DIAGNOSIS — D72829 Elevated white blood cell count, unspecified: Secondary | ICD-10-CM

## 2018-10-24 DIAGNOSIS — Z23 Encounter for immunization: Secondary | ICD-10-CM | POA: Diagnosis not present

## 2018-10-24 LAB — CBC
HCT: 33.6 % — ABNORMAL LOW (ref 36.0–46.0)
Hemoglobin: 11.1 g/dL — ABNORMAL LOW (ref 12.0–15.0)
MCHC: 33.2 g/dL (ref 30.0–36.0)
MCV: 79.3 fl (ref 78.0–100.0)
Platelets: 363 10*3/uL (ref 150.0–400.0)
RBC: 4.24 Mil/uL (ref 3.87–5.11)
RDW: 14.2 % (ref 11.5–15.5)
WBC: 11 10*3/uL — ABNORMAL HIGH (ref 4.0–10.5)

## 2018-10-24 LAB — LIPID PANEL
Cholesterol: 158 mg/dL (ref 0–200)
HDL: 40.3 mg/dL (ref >39.00)
LDL Cholesterol: 98 mg/dL (ref 0–99)
NonHDL: 117.33
Total CHOL/HDL Ratio: 4
Triglycerides: 95 mg/dL (ref 0.0–149.0)
VLDL: 19 mg/dL (ref 0.0–40.0)

## 2018-10-24 LAB — COMPREHENSIVE METABOLIC PANEL
ALT: 16 U/L (ref 0–35)
AST: 14 U/L (ref 0–37)
Albumin: 4.3 g/dL (ref 3.5–5.2)
Alkaline Phosphatase: 95 U/L (ref 39–117)
BUN: 11 mg/dL (ref 6–23)
CO2: 30 mEq/L (ref 19–32)
Calcium: 9.3 mg/dL (ref 8.4–10.5)
Chloride: 106 mEq/L (ref 96–112)
Creatinine, Ser: 0.79 mg/dL (ref 0.40–1.20)
GFR: 97.03 mL/min (ref >60.00)
Glucose, Bld: 93 mg/dL (ref 70–99)
Potassium: 4.1 mEq/L (ref 3.5–5.1)
Sodium: 142 mEq/L (ref 135–145)
Total Bilirubin: 0.7 mg/dL (ref 0.2–1.2)
Total Protein: 6.7 g/dL (ref 6.0–8.3)

## 2018-10-24 NOTE — Patient Instructions (Signed)
Give us 2-3 business days to get the results of your labs back.   Keep the diet clean and stay active.  Let us know if you need anything. 

## 2018-10-24 NOTE — Progress Notes (Signed)
Chief Complaint  Patient presents with  . Annual Exam     Well Woman Stacey Conner is here for a complete physical.   Her last physical was >1 year ago.  Current diet: in general, diet could be better. Current exercise: walking.  Weight is stable and she denies daytime fatigue.  Seatbelt? Yes   Health Maintenance Pap/HPV- Yes Mammogram- Yes Tetanus- Yes HIV screening- Yes  Past Medical History:  Diagnosis Date  . Allergy   . Frequent headaches   . Migraine   . Thyroid disease   . Vaginal Pap smear, abnormal 2006     Past Surgical History:  Procedure Laterality Date  . ECTOPIC PREGNANCY SURGERY  2016  . LEEP  2006  . TONSILLECTOMY  1984    Medications  Current Outpatient Medications on File Prior to Visit  Medication Sig Dispense Refill  . metroNIDAZOLE (FLAGYL) 500 MG tablet Take 1 tablet (500 mg total) by mouth 2 (two) times daily. 14 tablet 0  . naproxen (EC NAPROSYN) 500 MG EC tablet Take 1 tablet (500 mg total) by mouth 2 (two) times daily as needed. 30 tablet 1  . omeprazole (PRILOSEC) 20 MG capsule Take 1 capsule (20 mg total) by mouth daily. 90 capsule 1    Allergies Allergies  Allergen Reactions  . Sulfa Antibiotics     Review of Systems: Constitutional:  no unexpected weight changes Eye:  no recent significant change in vision Ear/Nose/Mouth/Throat:  Ears:  no recent change in hearing Nose/Mouth/Throat:  no complaints of nasal congestion, no sore throat Cardiovascular: no chest pain Respiratory:  no shortness of breath Gastrointestinal:  no abdominal pain, no change in bowel habits GU:  Female: negative for dysuria or pelvic pain; +intermittent incomplete emptying Musculoskeletal/Extremities:  no pain of the joints Integumentary (Skin/Breast):  no abnormal skin lesions reported Neurologic:  no headaches Endocrine:  denies fatigue Hematologic/Lymphatic:  No areas of easy bleeding  Exam BP 110/76 (BP Location: Left Arm, Patient Position:  Sitting, Cuff Size: Normal)   Pulse 94   Temp 98.1 F (36.7 C) (Temporal)   Ht 5\' 4"  (1.626 m)   Wt 190 lb 6 oz (86.4 kg)   SpO2 98%   BMI 32.68 kg/m  General:  well developed, well nourished, in no apparent distress Skin:  no significant moles, warts, or growths Head:  no masses, lesions, or tenderness Eyes:  pupils equal and round, sclera anicteric without injection Ears:  canals without lesions, TMs shiny without retraction, no obvious effusion, no erythema Nose:  nares patent, septum midline, mucosa normal, and no drainage or sinus tenderness Throat/Pharynx:  lips and gingiva without lesion; tongue and uvula midline; non-inflamed pharynx; no exudates or postnasal drainage Neck: neck supple without adenopathy, thyromegaly, or masses Lungs:  clear to auscultation, breath sounds equal bilaterally, no respiratory distress Cardio:  regular rate and rhythm, no LE edema Abdomen:  abdomen soft, nontender; bowel sounds normal; no masses or organomegaly Genital: Defer to GYN Musculoskeletal:  symmetrical muscle groups noted without atrophy or deformity Extremities:  no clubbing, cyanosis, or edema, no deformities, no skin discoloration Neuro:  gait normal; deep tendon reflexes normal and symmetric Psych: well oriented with normal range of affect and appropriate judgment/insight  Assessment and Plan  Well adult exam - Plan: CBC, Comprehensive metabolic panel, Lipid panel  Need for influenza vaccination - Plan: Flu Vaccine QUAD 6+ mos PF IM (Fluarix Quad PF)   Well 43 y.o. female. Counseled on diet and exercise. Mind caffeine intake. Make sure  bowels are normal. If no improvement and GYN has no input (appt this week), will refer to urology.  Other orders as above. Follow up in 1 yr or prn. The patient voiced understanding and agreement to the plan.  Jilda Roche [place], DO 10/24/18 10:45 AM

## 2018-10-27 ENCOUNTER — Other Ambulatory Visit: Payer: Self-pay

## 2018-10-27 ENCOUNTER — Ambulatory Visit (INDEPENDENT_AMBULATORY_CARE_PROVIDER_SITE_OTHER): Payer: 59 | Admitting: Family Medicine

## 2018-10-27 ENCOUNTER — Encounter: Payer: Self-pay | Admitting: Family Medicine

## 2018-10-27 VITALS — BP 141/74 | HR 85

## 2018-10-27 DIAGNOSIS — Z975 Presence of (intrauterine) contraceptive device: Secondary | ICD-10-CM

## 2018-10-27 DIAGNOSIS — Z113 Encounter for screening for infections with a predominantly sexual mode of transmission: Secondary | ICD-10-CM

## 2018-10-27 DIAGNOSIS — B9689 Other specified bacterial agents as the cause of diseases classified elsewhere: Secondary | ICD-10-CM

## 2018-10-27 DIAGNOSIS — N760 Acute vaginitis: Secondary | ICD-10-CM | POA: Diagnosis not present

## 2018-10-27 DIAGNOSIS — Z1239 Encounter for other screening for malignant neoplasm of breast: Secondary | ICD-10-CM | POA: Diagnosis not present

## 2018-10-27 DIAGNOSIS — Z30432 Encounter for removal of intrauterine contraceptive device: Secondary | ICD-10-CM

## 2018-10-27 DIAGNOSIS — N898 Other specified noninflammatory disorders of vagina: Secondary | ICD-10-CM

## 2018-10-27 DIAGNOSIS — N921 Excessive and frequent menstruation with irregular cycle: Secondary | ICD-10-CM

## 2018-10-27 DIAGNOSIS — N644 Mastodynia: Secondary | ICD-10-CM | POA: Diagnosis not present

## 2018-10-27 MED ORDER — NORETHIN ACE-ETH ESTRAD-FE 1-20 MG-MCG(24) PO TABS: 1.0000 | ORAL_TABLET | Freq: Every day | ORAL | 3 refills | Status: DC

## 2018-10-27 NOTE — Progress Notes (Signed)
Patient has had irregular bleeding with IUD.Patient has had breast pain from nipple radiating out to outer edge of breast.  Kathrene Alu RN

## 2018-10-27 NOTE — Progress Notes (Signed)
   Subjective:    Patient ID: Stacey Conner, female    DOB: November 20, 1975, 43 y.o.   MRN: [id number]  HPI  Having spotting/light bleeding for last 3-4 months with IUD. Has bleeding about 20 days out of the month. Would like to remove IUD.  Having mild to moderate pain on outside of breast. No injury, lumps, nipple discharge, changes to skin.  Review of Systems     Objective:   Physical Exam Exam conducted with a chaperone present.  Constitutional:      Appearance: Normal appearance.  HENT:     Head: Normocephalic and atraumatic.  Cardiovascular:     Rate and Rhythm: Normal rate.  Pulmonary:     Effort: Pulmonary effort is normal.  Chest:     Breasts:        Right: Tenderness (at 9 o'clock, starting at areola and radiating out.) present. No swelling, bleeding, inverted nipple, mass, nipple discharge or skin change.        Left: Tenderness (at 3 o'clock at edge of areola and radiating out) present. No swelling, bleeding, inverted nipple, mass, nipple discharge or skin change.    Abdominal:     Hernia: There is no hernia in the left inguinal area or right inguinal area.  Genitourinary:    Labia:        Right: No rash, tenderness or lesion.        Left: No rash, tenderness or lesion.      Vagina: No signs of injury and foreign body. No vaginal discharge, erythema, tenderness, bleeding, lesions or prolapsed vaginal walls.     Cervix: No cervical motion tenderness, discharge, friability, lesion, erythema or cervical bleeding.  Lymphadenopathy:     Lower Body: No right inguinal adenopathy. No left inguinal adenopathy.  Skin:    Capillary Refill: Capillary refill takes less than 2 seconds.  Neurological:     Mental Status: She is alert.     IUD Removal  Patient was in the dorsal lithotomy position, normal external genitalia was noted.  A speculum was placed in the patient's vagina, normal discharge was noted, no lesions. The multiparous cervix was visualized, no lesions, no  abnormal discharge,  and was swabbed with Betadine using scopettes.  The strings of the IUD was grasped and pulled using ring forceps.  The IUD was successfully removed in its entirety.  Patient tolerated the procedure well.        Assessment & Plan:  1. Breast pain No masses palpated. Will check mammogram  2. Breakthrough bleeding with IUD Discussed options of OCP trial. IUD removed. Start OCPs.  3. Breast cancer screening, high risk patient - [facility] DIGITAL SCREENING BILATERAL; Future  4. Vaginal discharge - Cervicovaginal ancillary only( [place])  5. Screening for STD (sexually transmitted disease) - Cervicovaginal ancillary only( [place])

## 2018-11-03 LAB — CERVICOVAGINAL ANCILLARY ONLY
Bacterial Vaginitis (gardnerella): POSITIVE — AB
Candida Glabrata: NEGATIVE
Candida Vaginitis: NEGATIVE
Chlamydia: NEGATIVE
Comment: NEGATIVE
Comment: NEGATIVE
Comment: NEGATIVE
Comment: NEGATIVE
Comment: NEGATIVE
Comment: NORMAL
Neisseria Gonorrhea: NEGATIVE
Trichomonas: NEGATIVE

## 2018-11-04 ENCOUNTER — Other Ambulatory Visit: Payer: Self-pay

## 2018-11-04 DIAGNOSIS — N760 Acute vaginitis: Secondary | ICD-10-CM

## 2018-11-04 DIAGNOSIS — B9689 Other specified bacterial agents as the cause of diseases classified elsewhere: Secondary | ICD-10-CM

## 2018-11-04 MED ORDER — METRONIDAZOLE 500 MG PO TABS: 500.0000 mg | ORAL_TABLET | Freq: Two times a day (BID) | ORAL | 0 refills | Status: DC

## 2018-11-04 MED ORDER — FLUCONAZOLE 150 MG PO TABS: 150.0000 mg | ORAL_TABLET | Freq: Once | ORAL | 0 refills | Status: AC

## 2018-11-24 ENCOUNTER — Other Ambulatory Visit (INDEPENDENT_AMBULATORY_CARE_PROVIDER_SITE_OTHER): Payer: 59

## 2018-11-24 ENCOUNTER — Other Ambulatory Visit: Payer: Self-pay

## 2018-11-24 DIAGNOSIS — D72829 Elevated white blood cell count, unspecified: Secondary | ICD-10-CM

## 2018-11-24 LAB — CBC WITH DIFFERENTIAL/PLATELET
Basophils Absolute: 0 10*3/uL (ref 0.0–0.1)
Basophils Relative: 0.3 % (ref 0.0–3.0)
Eosinophils Absolute: 0.2 10*3/uL (ref 0.0–0.7)
Eosinophils Relative: 2.3 % (ref 0.0–5.0)
HCT: 36 % (ref 36.0–46.0)
Hemoglobin: 11.9 g/dL — ABNORMAL LOW (ref 12.0–15.0)
Lymphocytes Relative: 32 % (ref 12.0–46.0)
Lymphs Abs: 3.5 10*3/uL (ref 0.7–4.0)
MCHC: 33.1 g/dL (ref 30.0–36.0)
MCV: 79.4 fl (ref 78.0–100.0)
Monocytes Absolute: 0.6 10*3/uL (ref 0.1–1.0)
Monocytes Relative: 5.8 % (ref 3.0–12.0)
Neutro Abs: 6.5 10*3/uL (ref 1.4–7.7)
Neutrophils Relative %: 59.6 % (ref 43.0–77.0)
Platelets: 371 10*3/uL (ref 150.0–400.0)
RBC: 4.54 Mil/uL (ref 3.87–5.11)
RDW: 13.9 % (ref 11.5–15.5)
WBC: 10.8 10*3/uL — ABNORMAL HIGH (ref 4.0–10.5)

## 2019-01-23 ENCOUNTER — Other Ambulatory Visit: Payer: Self-pay

## 2019-01-23 MED ORDER — NORETHIN ACE-ETH ESTRAD-FE 1-20 MG-MCG(24) PO TABS: 1.0000 | ORAL_TABLET | Freq: Every day | ORAL | 3 refills | Status: DC

## 2019-03-28 MED ORDER — DOXYCYCLINE HYCLATE 100 MG PO CAPS: 100.0000 mg | ORAL_CAPSULE | Freq: Two times a day (BID) | ORAL | 0 refills | Status: AC

## 2019-03-28 NOTE — Addendum Note (Signed)
Addended by: Levie Heritage on: 03/28/2019 06:27 PM   Modules accepted: Orders

## 2019-03-30 ENCOUNTER — Ambulatory Visit (INDEPENDENT_AMBULATORY_CARE_PROVIDER_SITE_OTHER): Payer: 59 | Admitting: Obstetrics & Gynecology

## 2019-03-30 ENCOUNTER — Encounter: Payer: Self-pay | Admitting: Obstetrics & Gynecology

## 2019-03-30 ENCOUNTER — Other Ambulatory Visit: Payer: Self-pay

## 2019-03-30 VITALS — BP 133/87 | HR 70 | Ht 64.0 in | Wt 193.0 lb

## 2019-03-30 DIAGNOSIS — K610 Anal abscess: Secondary | ICD-10-CM

## 2019-03-30 NOTE — Progress Notes (Signed)
History:  44 y.o. [id number] here today for eval of a genital abscess. She called yesterday and was started on atbx. The lesion got progressively more painful and opened and began to drain today. She reports less pain.    The following portions of the patient's history were reviewed and updated as appropriate: allergies, current medications, past family history, past medical history, past social history, past surgical history and problem list.  Review of Systems:  Pertinent items are noted in HPI.    Objective:  Physical Exam Blood pressure 133/87, pulse 70, height 5\' 4"  (1.626 m), weight 193 lb (87.5 kg), last menstrual period 03/24/2019.  CONSTITUTIONAL: Well-developed, well-nourished female in no acute distress.  HENT:  Normocephalic, atraumatic EYES: Conjunctivae and EOM are normal. No scleral icterus.  NECK: Normal range of motion SKIN: Skin is warm and dry. No rash noted. Not diaphoretic.No pallor. NEUROLGIC: Alert and oriented to person, place, and time. Normal coordination.   Pelvic: Normal appearing external genitalia. There is an area in the right side just lateral and superior to the anus where a small opening is noted which appears to be the region that drianed. (compared to the pics provided by the pt the lesion is markedly improved).    Assessment & Plan:  Peri anal abscess- improved.   Complete atbx x 5 days   F/u prn  Total face-to-face time with patient was 15 min.  Greater than 50% was spent in counseling and coordination of care with the patient.   Taquana Bartley L. Harraway-Smith, M.D., 03/26/2019

## 2019-04-04 ENCOUNTER — Other Ambulatory Visit: Payer: Self-pay

## 2019-04-05 ENCOUNTER — Other Ambulatory Visit: Payer: Self-pay

## 2019-04-05 ENCOUNTER — Encounter: Payer: Self-pay | Admitting: Family Medicine

## 2019-04-05 ENCOUNTER — Ambulatory Visit (INDEPENDENT_AMBULATORY_CARE_PROVIDER_SITE_OTHER): Payer: 59 | Admitting: Family Medicine

## 2019-04-05 VITALS — BP 128/82 | HR 75 | Temp 97.9°F | Ht 64.0 in | Wt 195.5 lb

## 2019-04-05 DIAGNOSIS — Z0184 Encounter for antibody response examination: Secondary | ICD-10-CM

## 2019-04-05 DIAGNOSIS — K219 Gastro-esophageal reflux disease without esophagitis: Secondary | ICD-10-CM | POA: Insufficient documentation

## 2019-04-05 DIAGNOSIS — Z0289 Encounter for other administrative examinations: Secondary | ICD-10-CM

## 2019-04-05 DIAGNOSIS — Z111 Encounter for screening for respiratory tuberculosis: Secondary | ICD-10-CM | POA: Diagnosis not present

## 2019-04-05 MED ORDER — NAPROXEN 500 MG PO TBEC: 500.0000 mg | DELAYED_RELEASE_TABLET | Freq: Two times a day (BID) | ORAL | 1 refills | Status: DC | PRN

## 2019-04-05 NOTE — Progress Notes (Signed)
Chief Complaint  Patient presents with  . Annual Exam    paperwork for school    Subjective: Patient is a 44 y.o. female here for completion of a form.  Pt starting LPN school, goal of becoming RN one day. Needs form filled out for school stating she is sound of body and mind. Diet is healthy overall. Gets routine exercise with walking. Mental health currently in good standing. Excited for new job.   GERD Takes omeprazole prn. No AE's. Spicy foods are triggers. No unintentional wt loss.    Past Medical History:  Diagnosis Date  . Allergy   . Frequent headaches   . Migraine   . Thyroid disease   . Vaginal Pap smear, abnormal 2006    Objective: BP 128/82 (BP Location: Left Arm, Patient Position: Sitting, Cuff Size: Normal)   Pulse 75   Temp 97.9 F (36.6 C) (Temporal)   Ht 5\' 4"  (1.626 m)   Wt 195 lb 8 oz (88.7 kg)   LMP 03/24/2019   SpO2 98%   BMI 33.56 kg/m  General: Awake, appears stated age HEENT: MMM, EOMi, ears neg, nares patent, no pharyngeal erythema or exudate Heart: RRR GI: BS+, S, NT, ND Neuro: DTR's equal and symmetric, no clons MSK: 5/5 strength throughout, no asymmetry Lungs: CTAB, no rales, wheezes or rhonchi. No accessory muscle use Psych: Age appropriate judgment and insight, normal affect and mood  Assessment and Plan: Gastroesophageal reflux disease, unspecified whether esophagitis present  Encounter for completion of form with patient  Encounter for immunological test - Plan: Hepatitis B surface antibody,quantitative  Screening-pulmonary TB - Plan: QuantiFERON-TB Gold Plus  1- Cont prn ppi.  2- Form to be complete. Ck titers. She is UTD w imms.  F/u as originally scheduled for CPE.  The patient voiced understanding and agreement to the plan.  03/26/2019 [place], DO 04/05/19  2:09 PM

## 2019-04-05 NOTE — Patient Instructions (Signed)
Give [place] 2-3 business days to get the results of your labs back.   We will call when your form is complete.   Keep the diet clean and stay active.

## 2019-04-07 LAB — QUANTIFERON-TB GOLD PLUS
Mitogen-NIL: 8.76 IU/mL
NIL: 0.04 IU/mL
QuantiFERON-TB Gold Plus: NEGATIVE
TB1-NIL: <0 IU/mL
TB2-NIL: 0 IU/mL

## 2019-04-07 LAB — HEPATITIS B SURFACE ANTIBODY, QUANTITATIVE: Hep B S AB Quant (Post): 120 m[IU]/mL (ref >=10)

## 2019-04-11 ENCOUNTER — Other Ambulatory Visit: Payer: Self-pay | Admitting: Family Medicine

## 2019-04-11 DIAGNOSIS — Z0184 Encounter for antibody response examination: Secondary | ICD-10-CM

## 2019-04-11 NOTE — Progress Notes (Signed)
va

## 2019-04-12 ENCOUNTER — Telehealth: Payer: Self-pay | Admitting: Family Medicine

## 2019-04-12 NOTE — Telephone Encounter (Signed)
Called the patient to inform need to schedule a lab appt for a Varicella titer to completed ECPI form. No appts available this week at our office, so put in the Varicella titer order to be done at the [facility] office. The patient voiced verbal understanding/where to go to have done and when results are in will complete the form and call to pickup. Form on CMA desk for completion.

## 2019-04-13 ENCOUNTER — Other Ambulatory Visit: Payer: 59

## 2019-04-13 ENCOUNTER — Ambulatory Visit: Payer: 59 | Attending: Internal Medicine

## 2019-04-13 DIAGNOSIS — Z23 Encounter for immunization: Secondary | ICD-10-CM

## 2019-04-13 DIAGNOSIS — Z0184 Encounter for antibody response examination: Secondary | ICD-10-CM

## 2019-04-13 NOTE — Progress Notes (Signed)
   Covid-19 Vaccination Clinic  Name:  Stacey Conner    MRN: [id number] DOB: 12/10/1975  04/13/2019  [name] was observed post Covid-19 immunization for 15 minutes without incident. She was provided with Vaccine Information Sheet and instruction to access the V-Safe system.   [name] was instructed to call 911 with any severe reactions post vaccine: Marland Kitchen Difficulty breathing  . Swelling of face and throat  . A fast heartbeat  . A bad rash all over body  . Dizziness and weakness   Immunizations Administered    Name Date Dose VIS Date Route   Pfizer COVID-19 Vaccine 04/13/2019  9:51 AM 0.3 mL 12/16/2018 Intramuscular   Manufacturer: [employer], [employer]   Lot: [id number]   NDC: [address]-9

## 2019-04-14 LAB — VARICELLA ZOSTER ANTIBODY, IGG: Varicella IgG: 484.7 index

## 2019-04-17 ENCOUNTER — Telehealth: Payer: Self-pay | Admitting: *Deleted

## 2019-04-17 ENCOUNTER — Encounter: Payer: Self-pay | Admitting: *Deleted

## 2019-04-17 NOTE — Telephone Encounter (Signed)
No answer, vm not set up.  Form is ready for pickup.  mychart message sent as well.

## 2019-05-08 ENCOUNTER — Ambulatory Visit: Payer: 59 | Attending: Internal Medicine

## 2019-05-08 DIAGNOSIS — Z23 Encounter for immunization: Secondary | ICD-10-CM

## 2019-05-08 NOTE — Progress Notes (Signed)
   Covid-19 Vaccination Clinic  Name:  Lelar Farewell    MRN: [id number] DOB: 01-04-1976  05/08/2019  Ms. Belanger was observed post Covid-19 immunization for 15 minutes without incident. She was provided with Vaccine Information Sheet and instruction to access the V-Safe system.   Ms. Holdren was instructed to call 911 with any severe reactions post vaccine: Marland Kitchen Difficulty breathing  . Swelling of face and throat  . A fast heartbeat  . A bad rash all over body  . Dizziness and weakness   Immunizations Administered    Name Date Dose VIS Date Route   Pfizer COVID-19 Vaccine 05/08/2019  3:02 PM 0.3 mL 03/01/2018 Intramuscular   Manufacturer: [employer], [employer]   Lot: [id number]   NDC: [address]-8

## 2019-05-25 ENCOUNTER — Ambulatory Visit (INDEPENDENT_AMBULATORY_CARE_PROVIDER_SITE_OTHER): Payer: 59

## 2019-05-25 ENCOUNTER — Other Ambulatory Visit: Payer: Self-pay

## 2019-05-25 ENCOUNTER — Ambulatory Visit (HOSPITAL_BASED_OUTPATIENT_CLINIC_OR_DEPARTMENT_OTHER): Payer: 59

## 2019-05-25 DIAGNOSIS — Z1239 Encounter for other screening for malignant neoplasm of breast: Secondary | ICD-10-CM | POA: Diagnosis not present

## 2019-10-09 ENCOUNTER — Other Ambulatory Visit: Payer: Self-pay | Admitting: Family Medicine

## 2019-10-30 ENCOUNTER — Other Ambulatory Visit: Payer: Self-pay

## 2019-10-30 ENCOUNTER — Encounter: Payer: Self-pay | Admitting: Family Medicine

## 2019-10-30 ENCOUNTER — Ambulatory Visit (INDEPENDENT_AMBULATORY_CARE_PROVIDER_SITE_OTHER): Payer: 59 | Admitting: Family Medicine

## 2019-10-30 VITALS — BP 122/80 | HR 85 | Temp 98.2°F | Ht 64.0 in | Wt 191.5 lb

## 2019-10-30 DIAGNOSIS — Z1159 Encounter for screening for other viral diseases: Secondary | ICD-10-CM | POA: Diagnosis not present

## 2019-10-30 DIAGNOSIS — Z23 Encounter for immunization: Secondary | ICD-10-CM

## 2019-10-30 DIAGNOSIS — Z0000 Encounter for general adult medical examination without abnormal findings: Secondary | ICD-10-CM

## 2019-10-30 DIAGNOSIS — E669 Obesity, unspecified: Secondary | ICD-10-CM | POA: Diagnosis not present

## 2019-10-30 DIAGNOSIS — D649 Anemia, unspecified: Secondary | ICD-10-CM | POA: Diagnosis not present

## 2019-10-30 LAB — CBC WITH DIFFERENTIAL/PLATELET
Absolute Monocytes: 587 cells/uL (ref 200–950)
Basophils Absolute: 46 cells/uL (ref 0–200)
Basophils Relative: 0.4 %
Eosinophils Absolute: 219 cells/uL (ref 15–500)
Eosinophils Relative: 1.9 %
HCT: 38.4 % (ref 35.0–45.0)
Hemoglobin: 12.6 g/dL (ref 11.7–15.5)
Lymphs Abs: 3473 cells/uL (ref 850–3900)
MCH: 25.5 pg — ABNORMAL LOW (ref 27.0–33.0)
MCHC: 32.8 g/dL (ref 32.0–36.0)
MCV: 77.7 fL — ABNORMAL LOW (ref 80.0–100.0)
MPV: 11.3 fL (ref 7.5–12.5)
Monocytes Relative: 5.1 %
Neutro Abs: 7176 cells/uL (ref 1500–7800)
Neutrophils Relative %: 62.4 %
Platelets: 381 10*3/uL (ref 140–400)
RBC: 4.94 10*6/uL (ref 3.80–5.10)
RDW: 15.3 % — ABNORMAL HIGH (ref 11.0–15.0)
Total Lymphocyte: 30.2 %
WBC: 11.5 10*3/uL — ABNORMAL HIGH (ref 3.8–10.8)

## 2019-10-30 LAB — COMPREHENSIVE METABOLIC PANEL
AG Ratio: 1.4 (calc) (ref 1.0–2.5)
ALT: 11 U/L (ref 6–29)
AST: 10 U/L (ref 10–30)
Albumin: 4 g/dL (ref 3.6–5.1)
Alkaline phosphatase (APISO): 68 U/L (ref 31–125)
BUN: 13 mg/dL (ref 7–25)
CO2: 22 mmol/L (ref 20–32)
Calcium: 9.3 mg/dL (ref 8.6–10.2)
Chloride: 106 mmol/L (ref 98–110)
Creat: 0.81 mg/dL (ref 0.50–1.10)
Globulin: 2.9 g/dL (calc) (ref 1.9–3.7)
Glucose, Bld: 96 mg/dL (ref 65–99)
Potassium: 4.6 mmol/L (ref 3.5–5.3)
Sodium: 137 mmol/L (ref 135–146)
Total Bilirubin: 0.6 mg/dL (ref 0.2–1.2)
Total Protein: 6.9 g/dL (ref 6.1–8.1)

## 2019-10-30 LAB — HEPATITIS C ANTIBODY
Hepatitis C Ab: NONREACTIVE
SIGNAL TO CUT-OFF: 0.01 (ref <1.00)

## 2019-10-30 LAB — LIPID PANEL
Cholesterol: 154 mg/dL (ref <200)
HDL: 47 mg/dL — ABNORMAL LOW (ref >=50)
LDL Cholesterol (Calc): 83 mg/dL (calc)
Non-HDL Cholesterol (Calc): 107 mg/dL (calc) (ref <130)
Total CHOL/HDL Ratio: 3.3 (calc) (ref <5.0)
Triglycerides: 143 mg/dL (ref <150)

## 2019-10-30 MED ORDER — ESCITALOPRAM OXALATE 10 MG PO TABS: 10.0000 mg | ORAL_TABLET | Freq: Every day | ORAL | 2 refills | Status: DC

## 2019-10-30 NOTE — Patient Instructions (Signed)
Give [place] 2-3 business days to get the results of your labs back.   Keep the diet clean and stay active.  Take 1/2 tab daily for the first 2 weeks of Lexapro.  Please consider counseling. Contact [phone] to schedule an appointment or inquire about cost/insurance coverage.  Let us know if you need anything.  Coping skills Choose 5 that work for you:  Take a deep breath  Count to 20  Read a book  Do a puzzle  Meditate  Bake  Sing  Knit  Garden  Pray  Go outside  Call a friend  Listen to music  Take a walk  Color  Send a note  Take a bath  Watch a movie  Be alone in a quiet place  Pet an animal  Visit a friend  Journal  Exercise  Stretch

## 2019-10-30 NOTE — Addendum Note (Signed)
Addended by: Scharlene Gloss B on: 10/30/2019 07:25 AM   Modules accepted: Orders

## 2019-10-30 NOTE — Progress Notes (Signed)
Chief Complaint  Patient presents with  . Annual Exam     Well Woman Stacey Conner is here for a complete physical.  Her last physical was >1 year ago.  Current diet: in general, diet is fair.  Current exercise: walking. Weight is stable and she denies fatigue out of ordinary. Seatbelt? Yes  Health Maintenance Pap/HPV- Yes Mammogram- Yes Tetanus- Yes Hep C screening- No HIV screening- Yes  Past Medical History:  Diagnosis Date  . Allergy   . Frequent headaches   . Migraine   . Thyroid disease   . Vaginal Pap smear, abnormal 2006     Past Surgical History:  Procedure Laterality Date  . ECTOPIC PREGNANCY SURGERY  2016  . LEEP  2006  . TONSILLECTOMY  1984   Medications  Current Outpatient Medications on File Prior to Visit  Medication Sig Dispense Refill  . naproxen (EC NAPROSYN) 500 MG EC tablet Take 1 tablet (500 mg total) by mouth 2 (two) times daily as needed. 30 tablet 1  . Norethindrone Acetate-Ethinyl Estrad-FE (LOESTRIN 24 FE) 1-20 MG-MCG(24) tablet Take 1 tablet by mouth daily. 3 Package 3  . omeprazole (PRILOSEC) 20 MG capsule Take 1 capsule (20 mg total) by mouth daily. 90 capsule 1   Allergies Allergies  Allergen Reactions  . Sulfa Antibiotics     Review of Systems: Constitutional:  no unexpected weight changes Eye:  no recent significant change in vision Ear/Nose/Mouth/Throat:  Ears:  no recent change in hearing Nose/Mouth/Throat:  no complaints of nasal congestion, no sore throat Cardiovascular: no chest pain Respiratory:  no shortness of breath Gastrointestinal:  no abdominal pain, no change in bowel habits GU:  Female: negative for dysuria or pelvic pain Musculoskeletal/Extremities:  no pain of the joints Integumentary (Skin/Breast):  no abnormal skin lesions reported Neurologic:  no headaches Endocrine:  denies fatigue Hematologic/Lymphatic:  No areas of easy bleeding  Exam BP 122/80 (BP Location: Left Arm, Patient Position: Sitting, Cuff  Size: Normal)   Pulse 85   Temp 98.2 F (36.8 C) (Oral)   Ht 5\' 4"  (1.626 m)   Wt 191 lb 8 oz (86.9 kg)   SpO2 98%   BMI 32.87 kg/m  General:  well developed, well nourished, in no apparent distress Skin:  no significant moles, warts, or growths Head:  no masses, lesions, or tenderness Eyes:  pupils equal and round, sclera anicteric without injection Ears:  canals without lesions, TMs shiny without retraction, no obvious effusion, no erythema Nose:  nares patent, septum midline, mucosa normal, and no drainage or sinus tenderness Throat/Pharynx:  lips and gingiva without lesion; tongue and uvula midline; non-inflamed pharynx; no exudates or postnasal drainage Neck: neck supple without adenopathy, thyromegaly, or masses Lungs:  clear to auscultation, breath sounds equal bilaterally, no respiratory distress Cardio:  regular rate and rhythm, no LE edema Abdomen:  abdomen soft, nontender; bowel sounds normal; no masses or organomegaly Genital: Defer to GYN Musculoskeletal:  symmetrical muscle groups noted without atrophy or deformity Extremities:  no clubbing, cyanosis, or edema, no deformities, no skin discoloration Neuro:  gait normal; deep tendon reflexes normal and symmetric Psych: well oriented with normal range of affect and appropriate judgment/insight  Assessment and Plan  Well adult exam  Low hemoglobin - Plan: CBC w/Diff  Obesity (BMI 30-39.9) - Plan: Comprehensive metabolic panel, Lipid panel  Encounter for hepatitis C screening test for low risk patient - Plan: Hepatitis C antibody   Well 44 y.o. female. Counseled on diet and exercise. Other  orders as above. Restart Lexapro 10 mg/d, will reck in 6 weeks. [facility] info given. Coping skills/tech's also given.  The patient voiced understanding and agreement to the plan.  Jilda Roche [place], DO 10/30/19 7:14 AM

## 2019-11-21 ENCOUNTER — Other Ambulatory Visit: Payer: Self-pay | Admitting: Family Medicine

## 2019-11-21 MED ORDER — ESCITALOPRAM OXALATE 10 MG PO TABS
10.0000 mg | ORAL_TABLET | Freq: Every day | ORAL | 2 refills | Status: DC
Start: 2019-11-21 — End: 2019-12-11

## 2019-12-08 ENCOUNTER — Ambulatory Visit: Payer: 59 | Attending: Internal Medicine

## 2019-12-08 DIAGNOSIS — Z23 Encounter for immunization: Secondary | ICD-10-CM

## 2019-12-08 NOTE — Progress Notes (Signed)
   Covid-19 Vaccination Clinic  Name:  Stacey Conner    MRN: [id number] DOB: 1975-07-06  12/08/2019  [name] was observed post Covid-19 immunization for 15 minutes without incident. She was provided with Vaccine Information Sheet and instruction to access the V-Safe system.   [name] was instructed to call 911 with any severe reactions post vaccine: Marland Kitchen Difficulty breathing  . Swelling of face and throat  . A fast heartbeat  . A bad rash all over body  . Dizziness and weakness   Immunizations Administered    Name Date Dose VIS Date Route   Pfizer COVID-19 Vaccine 12/08/2019  1:46 PM 0.3 mL 10/25/2019 Intramuscular   Manufacturer: [employer], [employer]   Lot: [id number]   NDC: [address]-8

## 2019-12-11 ENCOUNTER — Encounter: Payer: Self-pay | Admitting: Family Medicine

## 2019-12-11 ENCOUNTER — Other Ambulatory Visit: Payer: Self-pay

## 2019-12-11 ENCOUNTER — Telehealth (INDEPENDENT_AMBULATORY_CARE_PROVIDER_SITE_OTHER): Payer: 59 | Admitting: Family Medicine

## 2019-12-11 DIAGNOSIS — F411 Generalized anxiety disorder: Secondary | ICD-10-CM | POA: Diagnosis not present

## 2019-12-11 DIAGNOSIS — F325 Major depressive disorder, single episode, in full remission: Secondary | ICD-10-CM | POA: Insufficient documentation

## 2019-12-11 MED ORDER — NORETHIN ACE-ETH ESTRAD-FE 1-20 MG-MCG(24) PO TABS
ORAL_TABLET | ORAL | 3 refills | Status: DC
Start: 2019-12-11 — End: 2020-10-29

## 2019-12-11 MED ORDER — ESCITALOPRAM OXALATE 10 MG PO TABS
10.0000 mg | ORAL_TABLET | Freq: Every day | ORAL | 2 refills | Status: DC
Start: 2019-12-11 — End: 2020-10-29

## 2019-12-11 NOTE — Progress Notes (Signed)
Chief Complaint  Patient presents with  . Follow-up    Subjective Stacey Conner presents for f/u anxiety/depression. Due to COVID-19 pandemic, we are interacting via web portal for an electronic face-to-face visit. I verified patient's ID using 2 identifiers. Patient agreed to proceed with visit via this method. Patient is at home, I am at office. Patient and I are present for visit.   Pt is currently being treated with Lexapro 10 mg/d.  Reports doing well since treatment. No thoughts of harming self or others. No self-medication with alcohol, prescription drugs or illicit drugs. Pt is not following with a counselor/psychologist.  Past Medical History:  Diagnosis Date  . Allergy   . Frequent headaches   . Migraine   . Thyroid disease   . Vaginal Pap smear, abnormal 2006   Allergies as of 12/11/2019      Reactions   Sulfa Antibiotics       Medication List       Accurate as of December 11, 2019  7:40 AM. If you have any questions, ask your nurse or doctor.        STOP taking these medications   omeprazole 20 MG capsule Commonly known as: PRILOSEC Stopped by: Stacey Dory, DO     TAKE these medications   escitalopram 10 MG tablet Commonly known as: Lexapro Take 1 tablet (10 mg total) by mouth daily.   naproxen 500 MG EC tablet Commonly known as: EC NAPROSYN Take 1 tablet (500 mg total) by mouth 2 (two) times daily as needed.   Norethindrone Acetate-Ethinyl Estrad-FE 1-20 MG-MCG(24) tablet Commonly known as: LOESTRIN 24 FE Take 1 tab by mouth daily. What changed:   how much to take  how to take this  when to take this  additional instructions Changed by: Stacey Dory, DO       Exam No conversational dyspnea Age appropriate judgment and insight Nml affect and mood  Assessment and Plan  GAD (generalized anxiety disorder)  Depression, major, single episode, complete remission (HCC)  Most of s/s's were anxiety related, doing well,  cont Lexapro 10 mg/d. Her training wraps up in about 7.5 mo, can discuss if she wants to stop at that point.  F/u in 6 mo. The patient voiced understanding and agreement to the plan.  Stacey Roche [place], DO 12/11/19 7:40 AM

## 2020-03-13 ENCOUNTER — Ambulatory Visit: Payer: Self-pay

## 2020-04-09 ENCOUNTER — Ambulatory Visit (INDEPENDENT_AMBULATORY_CARE_PROVIDER_SITE_OTHER): Payer: 59

## 2020-04-09 ENCOUNTER — Other Ambulatory Visit: Payer: Self-pay

## 2020-04-09 DIAGNOSIS — Z111 Encounter for screening for respiratory tuberculosis: Secondary | ICD-10-CM | POA: Diagnosis not present

## 2020-04-09 NOTE — Progress Notes (Signed)
Patient here today for PPD placement. 0.36mL ppd placed in left lower forearm. Wheel present. Patient states she will be here Thursday to have read. She will need documentation of injeciton.

## 2020-04-11 LAB — TB SKIN TEST
Induration: 0 mm
TB Skin Test: NEGATIVE

## 2020-10-28 ENCOUNTER — Other Ambulatory Visit: Payer: Self-pay

## 2020-10-29 ENCOUNTER — Ambulatory Visit (INDEPENDENT_AMBULATORY_CARE_PROVIDER_SITE_OTHER): Payer: No Typology Code available for payment source | Admitting: Family Medicine

## 2020-10-29 ENCOUNTER — Encounter: Payer: Self-pay | Admitting: Family Medicine

## 2020-10-29 VITALS — BP 122/78 | HR 29 | Temp 99.7°F | Ht 64.0 in | Wt 195.2 lb

## 2020-10-29 DIAGNOSIS — Z0000 Encounter for general adult medical examination without abnormal findings: Secondary | ICD-10-CM | POA: Diagnosis not present

## 2020-10-29 DIAGNOSIS — Z23 Encounter for immunization: Secondary | ICD-10-CM

## 2020-10-29 MED ORDER — NORETHIN ACE-ETH ESTRAD-FE 1-20 MG-MCG(24) PO TABS
ORAL_TABLET | ORAL | 3 refills | Status: DC
Start: 2020-10-29 — End: 2021-12-08

## 2020-10-29 MED ORDER — DULOXETINE HCL 30 MG PO CPEP: 30.0000 mg | ORAL_CAPSULE | Freq: Every day | ORAL | 2 refills | Status: DC

## 2020-10-29 NOTE — Progress Notes (Signed)
Chief Complaint  Patient presents with   Annual Exam     Well Woman Stacey Conner is here for a complete physical.   Her last physical was >1 year ago.  Current diet: in general, a "healthy" diet. Current exercise: walking. Weight is stable and she denies fatigue out of ordinary. Seatbelt? Yes  Health Maintenance Pap/HPV- Yes Mammogram- Yes Tetanus- Yes Hep C screening- Yes HIV screening- Yes  Past Medical History:  Diagnosis Date   Allergy    Frequent headaches    Migraine    Thyroid disease    Vaginal Pap smear, abnormal 2006     Past Surgical History:  Procedure Laterality Date   ECTOPIC PREGNANCY SURGERY  2016   LEEP  2006   TONSILLECTOMY  1984    Medications  Current Outpatient Medications on File Prior to Visit  Medication Sig Dispense Refill   naproxen (EC NAPROSYN) 500 MG EC tablet Take 1 tablet (500 mg total) by mouth 2 (two) times daily as needed. 30 tablet 1   Allergies Allergies  Allergen Reactions   Sulfa Antibiotics     Review of Systems: Constitutional:  no unexpected weight changes Eye:  no recent significant change in vision Ear/Nose/Mouth/Throat:  Ears:  no recent change in hearing Nose/Mouth/Throat:  no complaints of nasal congestion, no sore throat Cardiovascular: no chest pain Respiratory:  no shortness of breath Gastrointestinal:  no abdominal pain, no change in bowel habits GU:  Female: negative for dysuria or pelvic pain Musculoskeletal/Extremities:  no pain of the joints Integumentary (Skin/Breast):  no abnormal skin lesions reported Neurologic:  no headaches Endocrine:  denies fatigue Hematologic/Lymphatic:  No areas of easy bleeding  Exam BP 122/78   Pulse (!) 29   Temp 99.7 F (37.6 C) (Oral)   Ht 5\' 4"  (1.626 m)   Wt 195 lb 4 oz (88.6 kg)   SpO2 99%   BMI 33.51 kg/m  General:  well developed, well nourished, in no apparent distress Skin:  no significant moles, warts, or growths Head:  no masses, lesions, or  tenderness Eyes:  pupils equal and round, sclera anicteric without injection Ears:  canals without lesions, TMs shiny without retraction, no obvious effusion, no erythema Nose:  nares patent, septum midline, mucosa normal, and no drainage or sinus tenderness Throat/Pharynx:  lips and gingiva without lesion; tongue and uvula midline; non-inflamed pharynx; no exudates or postnasal drainage Neck: neck supple without adenopathy, thyromegaly, or masses Lungs:  clear to auscultation, breath sounds equal bilaterally, no respiratory distress Cardio:  regular rate and rhythm, no LE edema Abdomen:  abdomen soft, nontender; bowel sounds normal; no masses or organomegaly Genital: Defer to GYN Musculoskeletal:  symmetrical muscle groups noted without atrophy or deformity Extremities:  no clubbing, cyanosis, or edema, no deformities, no skin discoloration Neuro:  gait normal; deep tendon reflexes normal and symmetric Psych: well oriented with normal range of affect and appropriate judgment/insight  Assessment and Plan  Well adult exam - Plan: CBC, Comprehensive metabolic panel, Lipid panel, TSH  Need for influenza vaccination - Plan: Flu Vaccine QUAD 6+ mos PF IM (Fluarix Quad PF)   Well 45 y.o. female. Counseled on diet and exercise. Other orders as above. Flu shot today. Covid bivalent booster rec'd.  Follow up in 6 mo. The patient voiced understanding and agreement to the plan.  [address] [place], DO 10/29/20 1:36 PM

## 2020-10-29 NOTE — Patient Instructions (Signed)
Give us 2-3 business days to get the results of your labs back.   Keep the diet clean and stay active.  I recommend getting the updated bivalent covid vaccination booster at your convenience.   Let us know if you need anything. 

## 2020-10-30 LAB — COMPREHENSIVE METABOLIC PANEL
ALT: 19 U/L (ref 0–35)
AST: 15 U/L (ref 0–37)
Albumin: 4.3 g/dL (ref 3.5–5.2)
Alkaline Phosphatase: 101 U/L (ref 39–117)
BUN: 11 mg/dL (ref 6–23)
CO2: 27 mEq/L (ref 19–32)
Calcium: 9.3 mg/dL (ref 8.4–10.5)
Chloride: 105 mEq/L (ref 96–112)
Creatinine, Ser: 0.82 mg/dL (ref 0.40–1.20)
GFR: 87.83 mL/min (ref >60.00)
Glucose, Bld: 84 mg/dL (ref 70–99)
Potassium: 4.3 mEq/L (ref 3.5–5.1)
Sodium: 138 mEq/L (ref 135–145)
Total Bilirubin: 0.8 mg/dL (ref 0.2–1.2)
Total Protein: 7.3 g/dL (ref 6.0–8.3)

## 2020-10-30 LAB — CBC
HCT: 37.3 % (ref 36.0–46.0)
Hemoglobin: 12.1 g/dL (ref 12.0–15.0)
MCHC: 32.5 g/dL (ref 30.0–36.0)
MCV: 77.4 fl — ABNORMAL LOW (ref 78.0–100.0)
Platelets: 337 10*3/uL (ref 150.0–400.0)
RBC: 4.81 Mil/uL (ref 3.87–5.11)
RDW: 15.9 % — ABNORMAL HIGH (ref 11.5–15.5)
WBC: 10.1 10*3/uL (ref 4.0–10.5)

## 2020-10-30 LAB — LIPID PANEL
Cholesterol: 176 mg/dL (ref 0–200)
HDL: 63 mg/dL (ref >39.00)
LDL Cholesterol: 92 mg/dL (ref 0–99)
NonHDL: 113.45
Total CHOL/HDL Ratio: 3
Triglycerides: 105 mg/dL (ref 0.0–149.0)
VLDL: 21 mg/dL (ref 0.0–40.0)

## 2020-10-30 LAB — TSH: TSH: 0.49 u[IU]/mL (ref 0.35–5.50)

## 2021-01-05 HISTORY — PX: ECTOPIC PREGNANCY SURGERY: SHX613

## 2021-03-06 ENCOUNTER — Encounter: Payer: No Typology Code available for payment source | Admitting: Obstetrics and Gynecology

## 2021-03-25 ENCOUNTER — Encounter: Payer: Self-pay | Admitting: Family Medicine

## 2021-04-29 ENCOUNTER — Ambulatory Visit: Payer: No Typology Code available for payment source | Admitting: Family Medicine

## 2021-07-07 IMAGING — MG DIGITAL SCREENING BILAT W/ TOMO W/ CAD
8 series · 8 of 24 positions shown · non-contrast
Comparison: Previous exam(s).

CLINICAL DATA: Screening.

EXAM:
DIGITAL SCREENING BILATERAL MAMMOGRAM WITH TOMO AND CAD

[R CC synth-2D]
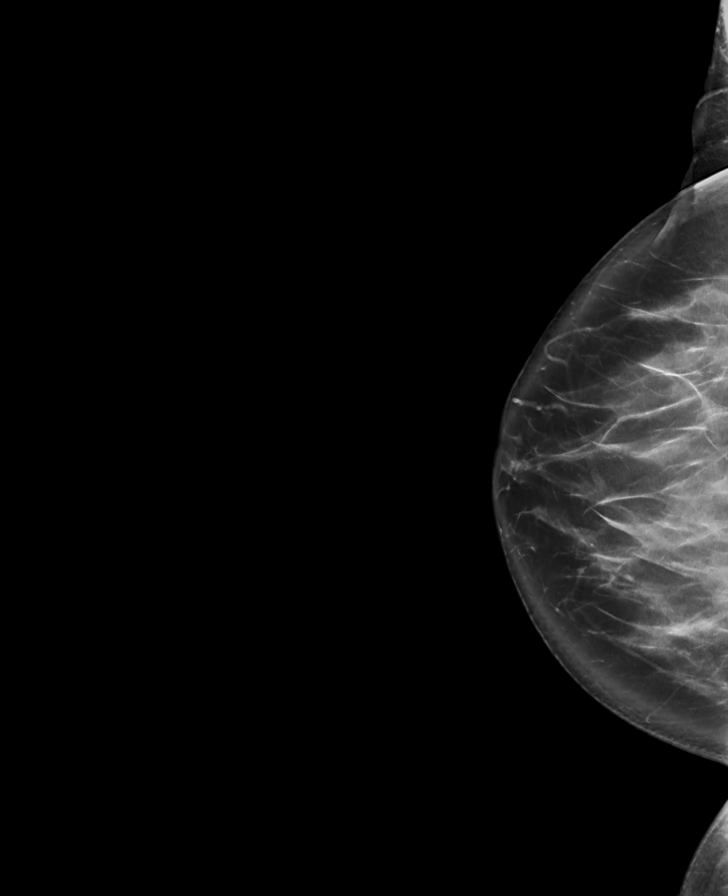

[R MLO synth-2D]
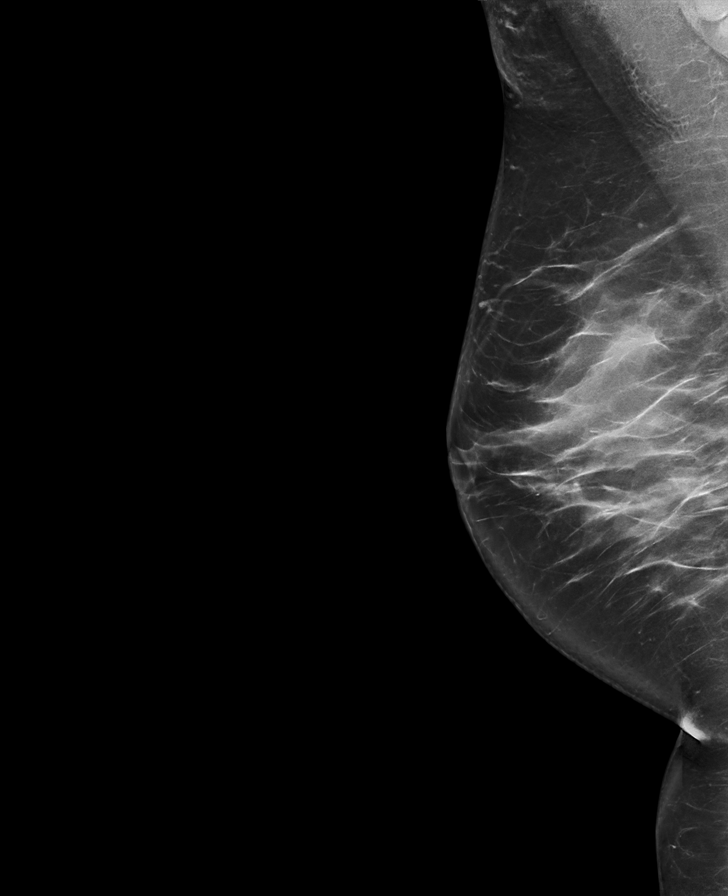

[L MLO synth-2D]
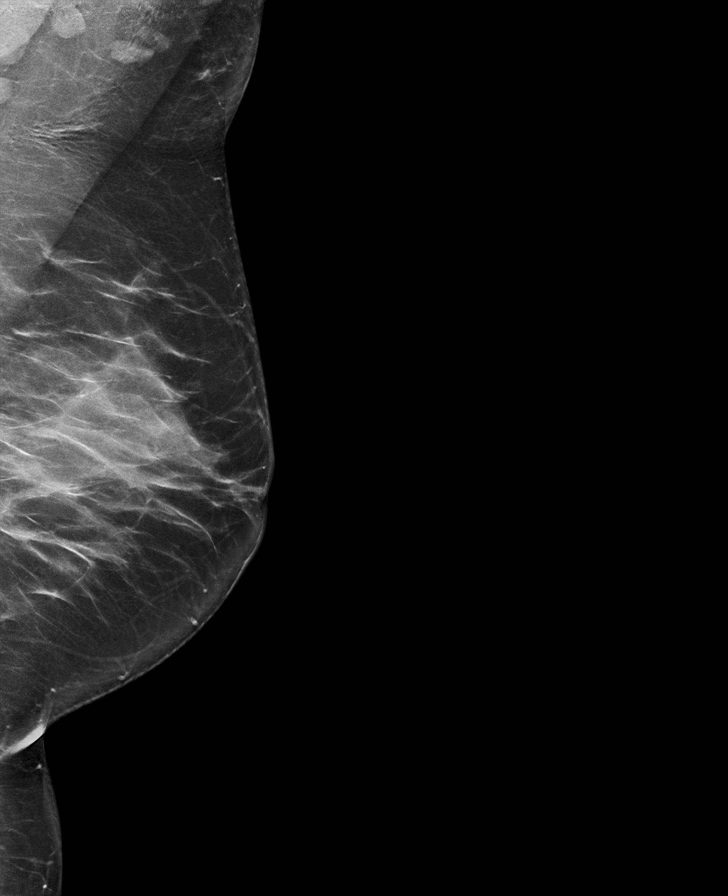

[L CC synth-2D]
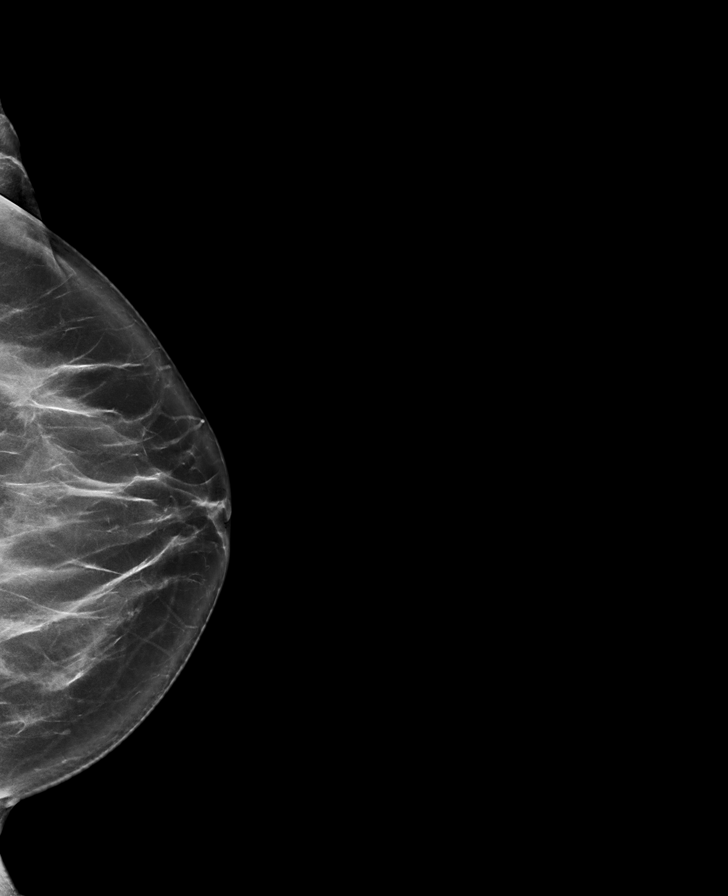

[L MLO tomo · tomo slice 45/88.0]
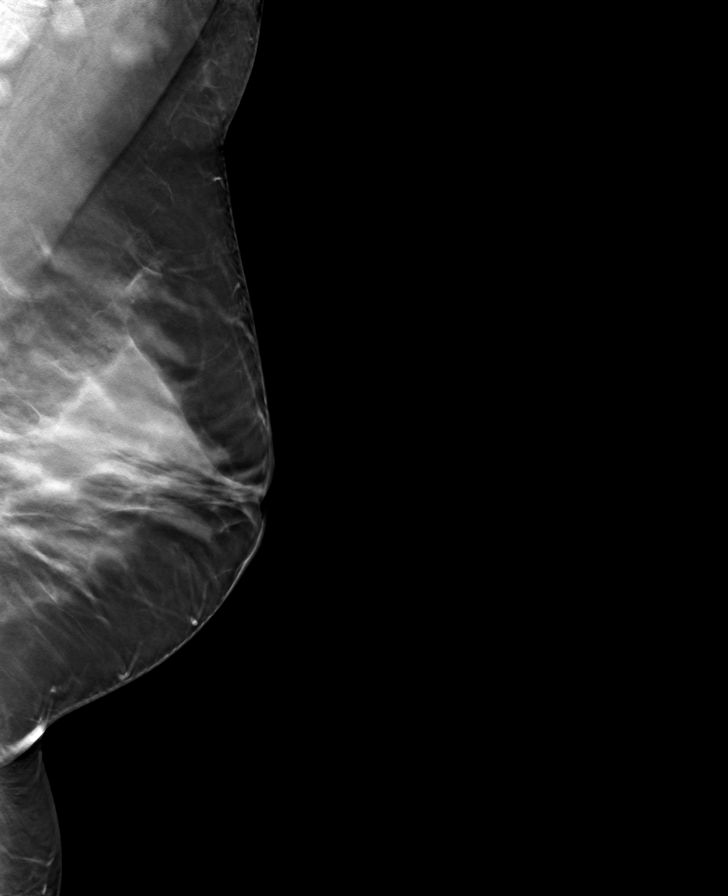

[R CC tomo · tomo slice 45/88.0]
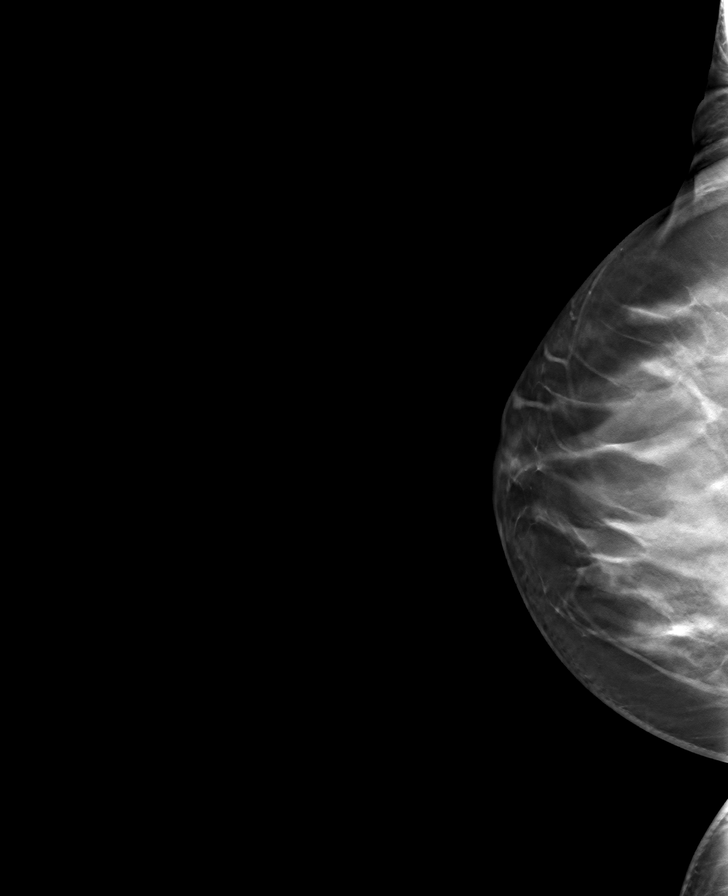

[R MLO tomo · tomo slice 45/90.0]
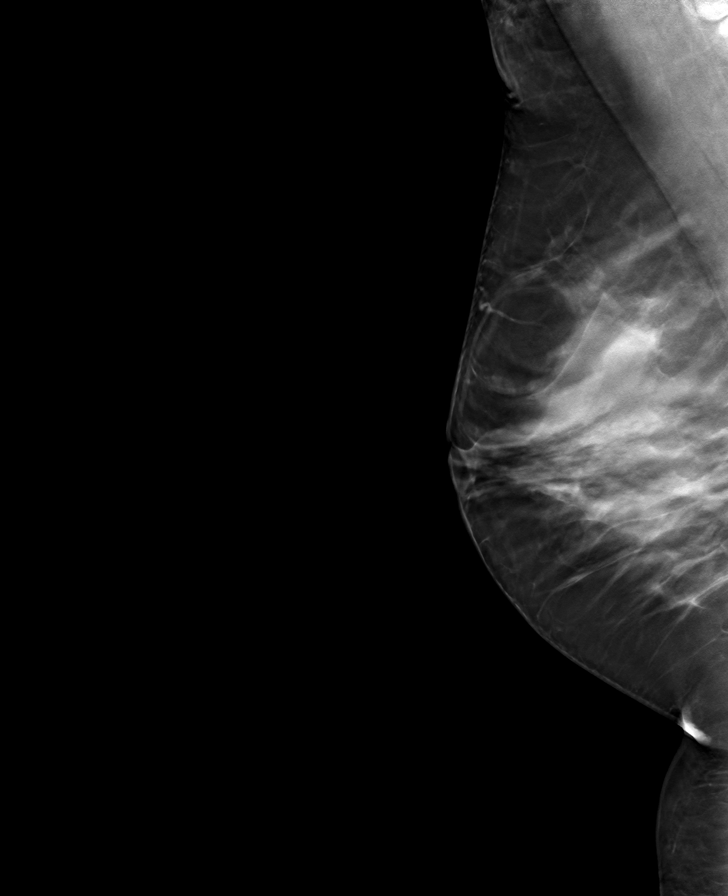

[L CC tomo · tomo slice 40/79.0]
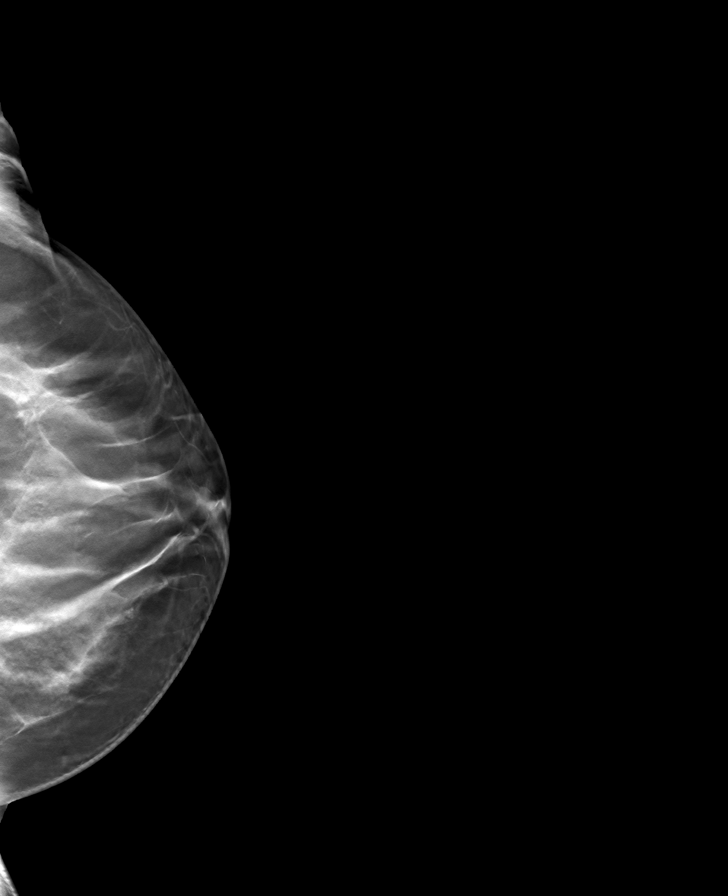

[8 of 24 positions shown; findings below may reference images not displayed]

ACR Breast Density Category c: The breast tissue is heterogeneously
dense, which may obscure small masses.
FINDINGS: There are no findings suspicious for malignancy. Images were
processed with CAD.
IMPRESSION: No mammographic evidence of malignancy. A result letter of this
screening mammogram will be mailed directly to the patient.

RECOMMENDATION:
Screening mammogram in one year. (Code:[id number])

BI-RADS CATEGORY  1: Negative.

## 2021-10-30 ENCOUNTER — Encounter: Payer: Self-pay | Admitting: Family Medicine

## 2021-10-31 ENCOUNTER — Encounter: Payer: Self-pay | Admitting: Family Medicine

## 2021-12-08 ENCOUNTER — Other Ambulatory Visit (HOSPITAL_COMMUNITY): Payer: Self-pay

## 2021-12-08 ENCOUNTER — Ambulatory Visit: Payer: Commercial Managed Care - PPO | Admitting: Family Medicine

## 2021-12-08 ENCOUNTER — Encounter: Payer: Self-pay | Admitting: Family Medicine

## 2021-12-08 VITALS — BP 144/98 | HR 82 | Temp 98.7°F | Ht 64.0 in | Wt 192.2 lb

## 2021-12-08 DIAGNOSIS — I10 Essential (primary) hypertension: Secondary | ICD-10-CM

## 2021-12-08 DIAGNOSIS — F325 Major depressive disorder, single episode, in full remission: Secondary | ICD-10-CM

## 2021-12-08 DIAGNOSIS — F411 Generalized anxiety disorder: Secondary | ICD-10-CM

## 2021-12-08 MED ORDER — DULOXETINE HCL 30 MG PO CPEP
30.0000 mg | ORAL_CAPSULE | Freq: Every day | ORAL | 2 refills | Status: DC
  Filled 2021-12-08: qty 90, 90d supply, fill #0
  Filled 2022-03-04 – 2022-03-16 (×2): qty 90, 90d supply, fill #1

## 2021-12-08 MED ORDER — AMLODIPINE BESYLATE 5 MG PO TABS
5.0000 mg | ORAL_TABLET | Freq: Every day | ORAL | 3 refills | Status: DC
  Filled 2021-12-08: qty 30, 30d supply, fill #0

## 2021-12-08 NOTE — Progress Notes (Signed)
Chief Complaint  Patient presents with   Osteoarthritis    Medication refills     Subjective Stacey Conner presents for f/u anxiety/depression.  Pt is currently being treated with Cymbalta 30 mg/d.  Reports doing well since treatment. No thoughts of harming self or others. No self-medication with alcohol, prescription drugs or illicit drugs. Pt is not following with a counselor/psychologist.  Elevated blood pressures Patient presents for elevated blood pressures. She does monitor home blood pressures. Blood pressures ranging on average from 140's/90's. She is not on any medication She is usually adhering to a healthy diet overall. No Cp or SOB.  Past Medical History:  Diagnosis Date   Allergy    Frequent headaches    Migraine    Thyroid disease    Vaginal Pap smear, abnormal 2006   Allergies as of 12/08/2021       Reactions   Sulfa Antibiotics         Medication List        Accurate as of December 08, 2021  3:59 PM. If you have any questions, ask your nurse or doctor.          STOP taking these medications    Norethindrone Acetate-Ethinyl Estrad-FE 1-20 MG-MCG(24) tablet Commonly known as: LOESTRIN 24 FE Stopped by: Sharlene Dory, DO       TAKE these medications    amLODipine 5 MG tablet Commonly known as: NORVASC Take 1 tablet (5 mg total) by mouth daily. Started by: Sharlene Dory, DO   DULoxetine 30 MG capsule Commonly known as: Cymbalta Take 1 capsule (30 mg total) by mouth daily.   naproxen 500 MG EC tablet Commonly known as: EC NAPROSYN Take 1 tablet (500 mg total) by mouth 2 (two) times daily as needed.        Exam BP (!) 144/98 (BP Location: Left Arm, Cuff Size: Normal)   Pulse 82   Temp 98.7 F (37.1 C) (Oral)   Ht 5\' 4"  (1.626 m)   Wt 192 lb 4 oz (87.2 kg)   SpO2 99%   BMI 33.00 kg/m  General:  well developed, well nourished, in no apparent distress Heart: RRR, no LE edema Lungs:  CTAB. No respiratory  distress Psych: well oriented with normal range of affect and age-appropriate judgement/insight, alert and oriented x4.  Assessment and Plan  GAD (generalized anxiety disorder) - Plan: DULoxetine (CYMBALTA) 30 MG capsule  Depression, major, single episode, complete remission (HCC) - Plan: DULoxetine (CYMBALTA) 30 MG capsule  Primary hypertension - Plan: amLODipine (NORVASC) 5 MG tablet  1/2. Chronic, stable. Cont Cymbalta 30 mg/d.  3. Chronic, unstable. Start norvasc 5 mg/d. F/u in a few weeks during CPE.  The patient voiced understanding and agreement to the plan.  [place], DO 12/08/21 3:59 PM

## 2021-12-08 NOTE — Patient Instructions (Signed)
Check your blood pressures 2-3 times per week, alternating the time of day you check it. If it is high, considering waiting 1-2 minutes and rechecking. If it gets higher, your anxiety is likely creeping up and we should avoid rechecking.   I want your blood pressure less than 140 on the top and less than 90 on the bottom consistently. Both goals must be met (ie, 150/70 is too high even though the 70 on the bottom is desirable).   Keep the diet clean and stay active.  Let us know if you need anything.

## 2021-12-09 ENCOUNTER — Other Ambulatory Visit (HOSPITAL_COMMUNITY): Payer: Self-pay

## 2021-12-23 ENCOUNTER — Other Ambulatory Visit: Payer: Self-pay | Admitting: Family Medicine

## 2021-12-23 ENCOUNTER — Encounter: Payer: Self-pay | Admitting: Family Medicine

## 2021-12-23 DIAGNOSIS — I10 Essential (primary) hypertension: Secondary | ICD-10-CM

## 2021-12-23 MED ORDER — AMLODIPINE BESYLATE 5 MG PO TABS: 10.0000 mg | ORAL_TABLET | Freq: Every day | ORAL | 3 refills | Status: DC

## 2021-12-30 ENCOUNTER — Other Ambulatory Visit (HOSPITAL_COMMUNITY): Payer: Self-pay

## 2021-12-30 ENCOUNTER — Encounter: Payer: Self-pay | Admitting: Family Medicine

## 2021-12-30 ENCOUNTER — Ambulatory Visit (INDEPENDENT_AMBULATORY_CARE_PROVIDER_SITE_OTHER): Payer: 59 | Admitting: Family Medicine

## 2021-12-30 VITALS — BP 123/79 | HR 78 | Temp 98.2°F | Resp 18 | Ht 64.0 in | Wt 190.6 lb

## 2021-12-30 DIAGNOSIS — Z0000 Encounter for general adult medical examination without abnormal findings: Secondary | ICD-10-CM | POA: Diagnosis not present

## 2021-12-30 DIAGNOSIS — D649 Anemia, unspecified: Secondary | ICD-10-CM | POA: Diagnosis not present

## 2021-12-30 LAB — COMPREHENSIVE METABOLIC PANEL
ALT: 12 U/L (ref 0–35)
AST: 13 U/L (ref 0–37)
Albumin: 4.3 g/dL (ref 3.5–5.2)
Alkaline Phosphatase: 104 U/L (ref 39–117)
BUN: 12 mg/dL (ref 6–23)
CO2: 29 mEq/L (ref 19–32)
Calcium: 9.5 mg/dL (ref 8.4–10.5)
Chloride: 102 mEq/L (ref 96–112)
Creatinine, Ser: 0.88 mg/dL (ref 0.40–1.20)
GFR: 80.04 mL/min (ref >60.00)
Glucose, Bld: 89 mg/dL (ref 70–99)
Potassium: 4.1 mEq/L (ref 3.5–5.1)
Sodium: 141 mEq/L (ref 135–145)
Total Bilirubin: 0.6 mg/dL (ref 0.2–1.2)
Total Protein: 7.1 g/dL (ref 6.0–8.3)

## 2021-12-30 LAB — CBC WITH DIFFERENTIAL/PLATELET
Basophils Absolute: 0.1 10*3/uL (ref 0.0–0.1)
Basophils Relative: 0.6 % (ref 0.0–3.0)
Eosinophils Absolute: 0.3 10*3/uL (ref 0.0–0.7)
Eosinophils Relative: 2.8 % (ref 0.0–5.0)
HCT: 36.1 % (ref 36.0–46.0)
Hemoglobin: 11.9 g/dL — ABNORMAL LOW (ref 12.0–15.0)
Lymphocytes Relative: 34.7 % (ref 12.0–46.0)
Lymphs Abs: 3.8 10*3/uL (ref 0.7–4.0)
MCHC: 33.1 g/dL (ref 30.0–36.0)
MCV: 79.1 fl (ref 78.0–100.0)
Monocytes Absolute: 0.8 10*3/uL (ref 0.1–1.0)
Monocytes Relative: 7.1 % (ref 3.0–12.0)
Neutro Abs: 5.9 10*3/uL (ref 1.4–7.7)
Neutrophils Relative %: 54.8 % (ref 43.0–77.0)
Platelets: 379 10*3/uL (ref 150.0–400.0)
RBC: 4.56 Mil/uL (ref 3.87–5.11)
RDW: 14.9 % (ref 11.5–15.5)
WBC: 10.8 10*3/uL — ABNORMAL HIGH (ref 4.0–10.5)

## 2021-12-30 LAB — LIPID PANEL
Cholesterol: 180 mg/dL (ref 0–200)
HDL: 59 mg/dL (ref >39.00)
LDL Cholesterol: 104 mg/dL — ABNORMAL HIGH (ref 0–99)
NonHDL: 121.37
Total CHOL/HDL Ratio: 3
Triglycerides: 89 mg/dL (ref 0.0–149.0)
VLDL: 17.8 mg/dL (ref 0.0–40.0)

## 2021-12-30 MED ORDER — NORETHIN ACE-ETH ESTRAD-FE 1-20 MG-MCG(24) PO TABS
1.0000 | ORAL_TABLET | Freq: Every day | ORAL | 3 refills | Status: DC
  Filled 2021-12-30: qty 84, 84d supply, fill #0
  Filled 2022-03-30: qty 84, 84d supply, fill #1
  Filled 2022-07-10: qty 84, 84d supply, fill #2
  Filled 2022-10-02: qty 84, 84d supply, fill #3

## 2021-12-30 MED ORDER — AMLODIPINE BESYLATE 10 MG PO TABS
10.0000 mg | ORAL_TABLET | Freq: Every day | ORAL | 3 refills | Status: DC
  Filled 2021-12-30: qty 90, 90d supply, fill #0
  Filled 2022-03-04 – 2022-03-30 (×2): qty 90, 90d supply, fill #1
  Filled 2022-07-10: qty 90, 90d supply, fill #2
  Filled 2022-10-28: qty 90, 90d supply, fill #3

## 2021-12-30 NOTE — Progress Notes (Signed)
Chief Complaint  Patient presents with   Annual Exam     Well Woman Stacey Conner is here for a complete physical.   Her last physical was >1 year ago.  Current diet: in general, a "healthy" diet. Current exercise: walking. Weight is stable and she denies fatigue out of ordinary. Patient's last menstrual period was 12/12/2021 (exact date). Seatbelt? Yes Advanced directive? No  Health Maintenance Pap/HPV- Yes Tetanus- Yes Hep C screening- Yes HIV screening- Yes  Past Medical History:  Diagnosis Date   Allergy    Frequent headaches    Migraine    Thyroid disease    Vaginal Pap smear, abnormal 2006     Past Surgical History:  Procedure Laterality Date   ECTOPIC PREGNANCY SURGERY  2016   LEEP  2006   TONSILLECTOMY  1984    Medications  Current Outpatient Medications on File Prior to Visit  Medication Sig Dispense Refill   DULoxetine (CYMBALTA) 30 MG capsule Take 1 capsule (30 mg total) by mouth daily. 90 capsule 2   naproxen (EC NAPROSYN) 500 MG EC tablet Take 1 tablet (500 mg total) by mouth 2 (two) times daily as needed. 30 tablet 1   No current facility-administered medications on file prior to visit.     Allergies Allergies  Allergen Reactions   Sulfa Antibiotics     Review of Systems: Constitutional:  no unexpected weight changes Eye:  no recent significant change in vision Ear/Nose/Mouth/Throat:  Ears:  no recent change in hearing Nose/Mouth/Throat:  no complaints of nasal congestion, no sore throat Cardiovascular: no chest pain Respiratory:  no shortness of breath Gastrointestinal:  no abdominal pain, no change in bowel habits GU:  Female: +freq and incontinence no pain Musculoskeletal/Extremities:  no new pain of the joints Integumentary (Skin/Breast):  no new abnormal skin lesions reported Neurologic:  no headaches Endocrine:  denies fatigue Hematologic/Lymphatic:  No areas of easy bleeding  Exam BP 123/79 (BP Location: Left Arm, Patient  Position: Sitting, Cuff Size: Large)   Pulse 78   Temp 98.2 F (36.8 C) (Oral)   Resp 18   Ht 5\' 4"  (1.626 m)   Wt 190 lb 9.6 oz (86.5 kg)   LMP 12/12/2021 (Exact Date)   SpO2 100%   BMI 32.72 kg/m  General:  well developed, well nourished, in no apparent distress Skin:  no significant moles, warts, or growths Head:  no masses, lesions, or tenderness Eyes:  pupils equal and round, sclera anicteric without injection Ears:  canals without lesions, TMs shiny without retraction, no obvious effusion, no erythema Nose:  nares patent, mucosa normal, and no drainage Throat/Pharynx:  lips and gingiva without lesion; tongue and uvula midline; non-inflamed pharynx; no exudates or postnasal drainage Neck: neck supple without adenopathy, thyromegaly, or masses Lungs:  clear to auscultation, breath sounds equal bilaterally, no respiratory distress Cardio:  regular rate and rhythm, no LE edema Abdomen:  abdomen soft, nontender; bowel sounds normal; no masses or organomegaly Genital: Defer to GYN Musculoskeletal:  symmetrical muscle groups noted without atrophy or deformity Extremities:  no clubbing, cyanosis, or edema, no deformities, no skin discoloration Neuro:  gait normal; deep tendon reflexes normal and symmetric Psych: well oriented with normal range of affect and appropriate judgment/insight  Assessment and Plan  Well adult exam - Plan: CBC w/Diff, Comprehensive metabolic panel, Lipid panel   Well 46 y.o. female. Counseled on diet and exercise. Advanced directive form provided today.  Discussed covid vaccine.  Other orders as above. Follow up in  6 mo. The patient voiced understanding and agreement to the plan.  Jilda Roche [place], DO 12/30/21 10:04 AM

## 2021-12-30 NOTE — Patient Instructions (Signed)
Give [place] 2-3 business days to get the results of your labs back.   Keep the diet clean and stay active.  Please get me a copy of your advanced directive form at your convenience.   Let me know if you would like to see pelvic floor PT.  Let us know if you need anything.  Knee Exercises It is normal to feel mild stretching, pulling, tightness, or discomfort as you do these exercises, but you should stop right away if you feel sudden pain or your pain gets worse.  STRETCHING AND RANGE OF MOTION EXERCISES  These exercises warm up your muscles and joints and improve the movement and flexibility of your knee. These exercises also help to relieve pain, numbness, and tingling. Exercise A: Knee Extension, Prone  Lie on your abdomen on a bed. Place your left / right knee just beyond the edge of the surface so your knee is not on the bed. You can put a towel under your left / right thigh just above your knee for comfort. Relax your leg muscles and allow gravity to straighten your knee. You should feel a stretch behind your left / right knee. Hold this position for 30 seconds. Scoot up so your knee is supported between repetitions. Repeat 2 times. Complete this stretch 3 times per week. Exercise B: Knee Flexion, Active     Lie on your back with both knees straight. If this causes back discomfort, bend your left / right knee so your foot is flat on the floor. Slowly slide your left / right heel back toward your buttocks until you feel a gentle stretch in the front of your knee or thigh. Hold this position for 30 seconds. Slowly slide your left / right heel back to the starting position. Repeat 2 times. Complete this exercise 3 times per week. Exercise C: Quadriceps, Prone     Lie on your abdomen on a firm surface, such as a bed or padded floor. Bend your left / right knee and hold your ankle. If you cannot reach your ankle or pant leg, loop a belt around your foot and grab the belt  instead. Gently pull your heel toward your buttocks. Your knee should not slide out to the side. You should feel a stretch in the front of your thigh and knee. Hold this position for 30 seconds. Repeat 2 times. Complete this stretch 3 times per week. Exercise D: Hamstring, Supine  Lie on your back. Loop a belt or towel over the ball of your left / right foot. The ball of your foot is on the walking surface, right under your toes. Straighten your left / right knee and slowly pull on the belt to raise your leg until you feel a gentle stretch behind your knee. Do not let your left / right knee bend while you do this. Keep your other leg flat on the floor. Hold this position for 30 seconds. Repeat 2 times. Complete this stretch 3 times per week. STRENGTHENING EXERCISES  These exercises build strength and endurance in your knee. Endurance is the ability to use your muscles for a long time, even after they get tired. Exercise E: Quadriceps, Isometric     Lie on your back with your left / right leg extended and your other knee bent. Put a rolled towel or small pillow under your knee if told by your health care provider. Slowly tense the muscles in the front of your left / right thigh. You should see your  kneecap slide up toward your hip or see increased dimpling just above the knee. This motion will push the back of the knee toward the floor. For 3 seconds, keep the muscle as tight as you can without increasing your pain. Relax the muscles slowly and completely. Repeat for 10 total reps Repeat 2 ti mes. Complete this exercise 3 times per week. Exercise F: Straight Leg Raises - Quadriceps  Lie on your back with your left / right leg extended and your other knee bent. Tense the muscles in the front of your left / right thigh. You should see your kneecap slide up or see increased dimpling just above the knee. Your thigh may even shake a bit. Keep these muscles tight as you raise your leg 4-6 inches  (10-15 cm) off the floor. Do not let your knee bend. Hold this position for 3 seconds. Keep these muscles tense as you lower your leg. Relax your muscles slowly and completely after each repetition. 10 total reps. Repeat 2 times. Complete this exercise 3 times per week.  Exercise G: Hamstring Curls     If told by your health care provider, do this exercise while wearing ankle weights. Begin with 5 lb weights (optional). Then increase the weight by 1 lb (0.5 kg) increments. Do not wear ankle weights that are more than 20 lbs to start with. Lie on your abdomen with your legs straight. Bend your left / right knee as far as you can without feeling pain. Keep your hips flat against the floor. Hold this position for 3 seconds. Slowly lower your leg to the starting position. Repeat for 10 reps.  Repeat 2 times. Complete this exercise 3 times per week. Exercise H: Squats (Quadriceps)  Stand in front of a table, with your feet and knees pointing straight ahead. You may rest your hands on the table for balance but not for support. Slowly bend your knees and lower your hips like you are going to sit in a chair. Keep your weight over your heels, not over your toes. Keep your lower legs upright so they are parallel with the table legs. Do not let your hips go lower than your knees. Do not bend lower than told by your health care provider. If your knee pain increases, do not bend as low. Hold the squat position for 1 second. Slowly push with your legs to return to standing. Do not use your hands to pull yourself to standing. Repeat 2 times. Complete this exercise 3 times per week. Exercise I: Wall Slides (Quadriceps)     Lean your back against a smooth wall or door while you walk your feet out 18-24 inches (46-61 cm) from it. Place your feet hip-width apart. Slowly slide down the wall or door until your knees Repeat 2 times. Complete this exercise every other day. Exercise K: Straight Leg Raises -  Hip Abductors  Lie on your side with your left / right leg in the top position. Lie so your head, shoulder, knee, and hip line up. You may bend your bottom knee to help you keep your balance. Roll your hips slightly forward so your hips are stacked directly over each other and your left / right knee is facing forward. Leading with your heel, lift your top leg 4-6 inches (10-15 cm). You should feel the muscles in your outer hip lifting. Do not let your foot drift forward. Do not let your knee roll toward the ceiling. Hold this position for 3 seconds. Slowly  return your leg to the starting position. Let your muscles relax completely after each repetition. 10 total reps. Repeat 2 times. Complete this exercise 3 times per week. Exercise J: Straight Leg Raises - Hip Extensors  Lie on your abdomen on a firm surface. You can put a pillow under your hips if that is more comfortable. Tense the muscles in your buttocks and lift your left / right leg about 4-6 inches (10-15 cm). Keep your knee straight as you lift your leg. Hold this position for 3 seconds. Slowly lower your leg to the starting position. Let your leg relax completely after each repetition. Repeat 2 times. Complete this exercise 3 times per week. Document Released: 11/05/2004 Document Revised: 09/16/2015 Document Reviewed: 10/28/2014 Elsevier Interactive Patient Education  2017 [employer].

## 2022-01-01 ENCOUNTER — Other Ambulatory Visit (INDEPENDENT_AMBULATORY_CARE_PROVIDER_SITE_OTHER): Payer: 59

## 2022-01-01 DIAGNOSIS — D649 Anemia, unspecified: Secondary | ICD-10-CM

## 2022-01-01 NOTE — Addendum Note (Signed)
Addended byConrad Mamers D on: 01/01/2022 10:13 AM   Modules accepted: Orders

## 2022-01-02 LAB — IBC + FERRITIN
Ferritin: 25 ng/mL (ref 10.0–291.0)
Iron: 52 ug/dL (ref 42–145)
Saturation Ratios: 12.9 % — ABNORMAL LOW (ref 20.0–50.0)
TIBC: 404.6 ug/dL (ref 250.0–450.0)
Transferrin: 289 mg/dL (ref 212.0–360.0)

## 2022-01-13 ENCOUNTER — Other Ambulatory Visit (HOSPITAL_COMMUNITY): Payer: Self-pay

## 2022-02-02 ENCOUNTER — Other Ambulatory Visit (INDEPENDENT_AMBULATORY_CARE_PROVIDER_SITE_OTHER): Payer: Commercial Managed Care - PPO

## 2022-02-02 ENCOUNTER — Encounter: Payer: Self-pay | Admitting: Family Medicine

## 2022-02-02 DIAGNOSIS — D649 Anemia, unspecified: Secondary | ICD-10-CM

## 2022-02-02 LAB — CBC
HCT: 35.5 % — ABNORMAL LOW (ref 36.0–46.0)
Hemoglobin: 11.9 g/dL — ABNORMAL LOW (ref 12.0–15.0)
MCHC: 33.6 g/dL (ref 30.0–36.0)
MCV: 77.7 fl — ABNORMAL LOW (ref 78.0–100.0)
Platelets: 331 10*3/uL (ref 150.0–400.0)
RBC: 4.57 Mil/uL (ref 3.87–5.11)
RDW: 14.7 % (ref 11.5–15.5)
WBC: 11.3 10*3/uL — ABNORMAL HIGH (ref 4.0–10.5)

## 2022-03-04 ENCOUNTER — Other Ambulatory Visit: Payer: Self-pay

## 2022-03-16 ENCOUNTER — Other Ambulatory Visit (HOSPITAL_COMMUNITY): Payer: Self-pay

## 2022-03-30 ENCOUNTER — Encounter: Payer: Self-pay | Admitting: Family Medicine

## 2022-03-30 ENCOUNTER — Telehealth (INDEPENDENT_AMBULATORY_CARE_PROVIDER_SITE_OTHER): Payer: Commercial Managed Care - PPO | Admitting: Family Medicine

## 2022-03-30 ENCOUNTER — Other Ambulatory Visit (HOSPITAL_COMMUNITY): Payer: Self-pay

## 2022-03-30 DIAGNOSIS — Z634 Disappearance and death of family member: Secondary | ICD-10-CM

## 2022-03-30 MED ORDER — CITALOPRAM HYDROBROMIDE 10 MG PO TABS
10.0000 mg | ORAL_TABLET | Freq: Every day | ORAL | 3 refills | Status: DC
  Filled 2022-03-30: qty 30, 30d supply, fill #0

## 2022-03-30 MED ORDER — PROPRANOLOL HCL 10 MG PO TABS
10.0000 mg | ORAL_TABLET | Freq: Three times a day (TID) | ORAL | 0 refills | Status: DC | PRN
  Filled 2022-03-30: qty 60, 20d supply, fill #0
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
## 2022-03-31 ENCOUNTER — Other Ambulatory Visit (HOSPITAL_COMMUNITY): Payer: Self-pay

## 2022-03-31 ENCOUNTER — Other Ambulatory Visit: Payer: Self-pay

## 2022-04-14 ENCOUNTER — Encounter: Payer: Self-pay | Admitting: Family Medicine

## 2022-04-14 ENCOUNTER — Other Ambulatory Visit (HOSPITAL_COMMUNITY): Payer: Self-pay

## 2022-04-14 ENCOUNTER — Other Ambulatory Visit: Payer: Self-pay | Admitting: Family Medicine

## 2022-04-14 MED ORDER — DULOXETINE HCL 60 MG PO CPEP
60.0000 mg | ORAL_CAPSULE | Freq: Every day | ORAL | 3 refills | Status: DC
  Filled 2022-04-14: qty 30, 30d supply, fill #0
  Filled 2022-06-22: qty 30, 30d supply, fill #1
  Filled 2022-07-29: qty 30, 30d supply, fill #2
  Filled 2022-08-31: qty 30, 30d supply, fill #3

## 2022-04-14 MED ORDER — BUSPIRONE HCL 7.5 MG PO TABS
7.5000 mg | ORAL_TABLET | Freq: Two times a day (BID) | ORAL | 0 refills | Status: DC
  Filled 2022-04-14: qty 60, 30d supply, fill #0

## 2022-04-15 ENCOUNTER — Other Ambulatory Visit (HOSPITAL_COMMUNITY): Payer: Self-pay

## 2022-05-05 ENCOUNTER — Ambulatory Visit (INDEPENDENT_AMBULATORY_CARE_PROVIDER_SITE_OTHER): Payer: Commercial Managed Care - PPO | Admitting: Psychology

## 2022-05-05 DIAGNOSIS — F325 Major depressive disorder, single episode, in full remission: Secondary | ICD-10-CM

## 2022-05-05 DIAGNOSIS — F411 Generalized anxiety disorder: Secondary | ICD-10-CM | POA: Diagnosis not present
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
## 2022-05-21 ENCOUNTER — Ambulatory Visit (INDEPENDENT_AMBULATORY_CARE_PROVIDER_SITE_OTHER): Payer: Commercial Managed Care - PPO | Admitting: Psychology

## 2022-05-21 DIAGNOSIS — F411 Generalized anxiety disorder: Secondary | ICD-10-CM

## 2022-05-21 DIAGNOSIS — F325 Major depressive disorder, single episode, in full remission: Secondary | ICD-10-CM | POA: Diagnosis not present
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
## 2022-05-25 ENCOUNTER — Other Ambulatory Visit (HOSPITAL_COMMUNITY): Payer: Self-pay

## 2022-05-25 ENCOUNTER — Other Ambulatory Visit: Payer: Self-pay | Admitting: Family Medicine

## 2022-05-25 MED ORDER — BUSPIRONE HCL 7.5 MG PO TABS
7.5000 mg | ORAL_TABLET | Freq: Two times a day (BID) | ORAL | 0 refills | Status: DC
  Filled 2022-05-25: qty 60, 30d supply, fill #0

## 2022-06-03 ENCOUNTER — Ambulatory Visit (INDEPENDENT_AMBULATORY_CARE_PROVIDER_SITE_OTHER): Payer: Commercial Managed Care - PPO | Admitting: Psychology

## 2022-06-03 DIAGNOSIS — F325 Major depressive disorder, single episode, in full remission: Secondary | ICD-10-CM | POA: Diagnosis not present

## 2022-06-03 DIAGNOSIS — F411 Generalized anxiety disorder: Secondary | ICD-10-CM | POA: Diagnosis not present

## 2022-06-03 NOTE — Progress Notes (Signed)
[facility] Counselor/Therapist Progress Note  Patient ID: Stacey Conner, MRN: [id number]   Date: 06/03/22  Time Spent: 1:04  pm - 1:48 pm : 44  Minutes  Treatment Type: Individual Therapy.  Reported Symptoms: Depression and anxiety.   Mental Status Exam: Appearance:  Neat     Behavior: Appropriate  Motor: Normal  Speech/Language:  Clear and Coherent  Affect: Depressed  Mood: depressed  Thought process: normal  Thought content:   WNL  Sensory/Perceptual disturbances:   WNL  Orientation: oriented to person, place, time/date, and situation  Attention: Good  Concentration: Good  Memory: WNL  Fund of knowledge:  Good  Insight:   Good  Judgment:  Good  Impulse Control: Good   Risk Assessment: Danger to Self:  No Self-injurious Behavior: No Danger to Others: No Duty to Warn:no Physical Aggression / Violence:No  Access to Firearms a concern: No  Gang Involvement:No   Subjective:   Stacey Conner participated from office, via video and consented to treatment. Therapist participated from home office. We met online due to COVID pandemic. Stacey Conner reviewed the events of the past week. Stacey Conner noted difficulty completing tasks and noted this being primarily due to motivation. She noted often thinking about her recently deceased fiancee. She noted feeling down, upset, and angry at self when she isn't able to engage. She noted numerous tasks to complete including cleaning bathroom, bedroom, closet, and car. She noted not being motivated to go outside and get fresh air and engaging in exercise. She noted other boundaries include reminders of her fiance. She noted ruminating often about his passing. Therapist validated Stacey Conner's feelings and experience. Therapist introduced BA during the session and provided psycho-education. We discussed the importance of schedule tasks, creaking down tasks to smaller tasks, preparing the needed tools to complete the task, identifying and  addressing barriers, making tasks more enjoyable (music), managing self-talk, and problem-solving. Therapist validated and normalized Sharell's feelings of grief. Therapist provided psycho-education regarding grief during the session. A follow-pup up was scheduled for continued treatment.   Interventions: CBT and BA.    Diagnosis:  Depression, major, single episode, complete remission (HCC)  GAD (generalized anxiety disorder)  Psychiatric Treatment: Yes , via PCP.    Treatment Plan:  Client Abilities/Strengths Stacey Conner is intelligent, forthcoming, and motivated for change.   Support System: Family.   Client Treatment Preferences Outpatient Therapy.   Client Statement of Needs Stacey Conner would like to improve motivation (ex. Household tasks), managing anger related to grief and lack of charges for murdered fiancee, spending less time with family who continue to bring up the loss, boundaries for others, processing grief, bolstering coping skills, reestablish daily routine, boundaries for self, managing rumination, and improving focus on self-care.   Treatment Level Weekly  Symptoms  Anxiety: feeling anxious, difficulty managing worry, worrying about different things, trouble relaxing, restless, irritability, feeling afraid something awful might happen.    (Status: maintained) Depression: Loss of interest, feeling down, poor sleep, lethargy, fluctuating appetite,    (Status: maintained)  Goals:   Cortny experiences symptoms of depression, anxiety, and grief.    Target Date: 05/21/23 Frequency: Weekly  Progress: 0 Modality: individual    Therapist will provide referrals for additional resources as appropriate.  Therapist will provide psycho-education regarding Janautica's diagnosis and corresponding treatment approaches and interventions. Licensed Clinical Social Worker, [place], LCSW will support the patient's ability to achieve the goals identified. will employ CBT, BA,  Problem-solving, Solution Focused, Mindfulness,  coping skills, & other evidenced-based practices will  be used to promote progress towards healthy functioning to help manage decrease symptoms associated with her diagnosis.   Reduce overall level, frequency, and intensity of the feelings of depression, anxiety and panic evidenced by decreased overall symptoms from 6 to 7 days/week to 0 to 1 days/week per client report for at least 3 consecutive months. Verbally express understanding of the relationship between feelings of depression, anxiety and their impact on thinking patterns and behaviors. Verbalize an understanding of the role that distorted thinking plays in creating fears, excessive worry, and ruminations.    Stacey Conner participated in the creation of the treatment plan)  Delight Ovens, LCSW

## 2022-06-16 ENCOUNTER — Ambulatory Visit (INDEPENDENT_AMBULATORY_CARE_PROVIDER_SITE_OTHER): Payer: Commercial Managed Care - PPO | Admitting: Psychology

## 2022-06-16 DIAGNOSIS — F325 Major depressive disorder, single episode, in full remission: Secondary | ICD-10-CM

## 2022-06-16 DIAGNOSIS — F411 Generalized anxiety disorder: Secondary | ICD-10-CM | POA: Diagnosis not present
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
## 2022-06-29 ENCOUNTER — Ambulatory Visit (INDEPENDENT_AMBULATORY_CARE_PROVIDER_SITE_OTHER): Payer: Commercial Managed Care - PPO | Admitting: Psychology

## 2022-06-29 DIAGNOSIS — F325 Major depressive disorder, single episode, in full remission: Secondary | ICD-10-CM | POA: Diagnosis not present

## 2022-06-29 DIAGNOSIS — F411 Generalized anxiety disorder: Secondary | ICD-10-CM

## 2022-06-29 NOTE — Progress Notes (Signed)
[facility] Counselor/Therapist Progress Note  Patient ID: Kathaleya Mcduffee, MRN: [id number]   Date: 06/29/22  Time Spent: 8:11  am - 8:55 am : 44  Minutes  Treatment Type: Individual Therapy.  Reported Symptoms: Depression and anxiety.   Mental Status Exam: Appearance:  Neat     Behavior: Appropriate  Motor: Normal  Speech/Language:  Clear and Coherent  Affect: Depressed  Mood: depressed  Thought process: normal  Thought content:   WNL  Sensory/Perceptual disturbances:   WNL  Orientation: oriented to person, place, time/date, and situation  Attention: Good  Concentration: Good  Memory: WNL  Fund of knowledge:  Good  Insight:   Good  Judgment:  Good  Impulse Control: Good   Risk Assessment: Danger to Self:  No Self-injurious Behavior: No Danger to Others: No Duty to Warn:no Physical Aggression / Violence:No  Access to Firearms a concern: No  Gang Involvement:No   Subjective:   Wynonia Hazard participated from home, via video, is aware of the limitations of tele-sessions, and consented to treatment. Therapist participated from office. Aldeen reviewed the events of the past week. Lexianna noted a recent trip with family and noted enjoying the trip overall. She noted a lack of discussion regarding the loss, which she previously had to set boundaries regarding. She noted being busy helping. She noted continued efforts to set a time for grieving, 30 minutes every other day, and noted redirecting self between these times. She note skipping those times when not feeling a need to grieve. She noted being able to delay her grief until then. We worked on processing her "anger" including feeling emotional and attitude shift towards others. She noted often brushing people off, walking away, and not answering people's question. We identified a lack of externalized anger but noted this being a reaction to her anger. She noted her sadness turning into anger as time passes with focus  on this. We discussed focusing on awareness and sharing with others in lieu of ignoring them when upset. She continues to workout 3x per week at work during her lunch. She noted continued reduced appetite and noted often eating one meal a day. She noted barriers including "feeling full", being "indecisive", work life-style, and mood. She noted eating being an issue prior to Bobby's passing. We discussed being mindful and purposeful with eating, creating meal plans, and preparing food. We discussed employing a notebook to be more purposeful with time and create a routine and meal plan. [name] was engaged and motivated. She expressed commitment towards our goals. Therapist praised [place] and provided supportive therapy. A follow-up was scheduled for continued treatment.   Interventions: CBT and BA.    Diagnosis:  Depression, major, single episode, complete remission (HCC)  GAD (generalized anxiety disorder)  Psychiatric Treatment: Yes , via PCP.    Treatment Plan:  Client Abilities/Strengths Genever is intelligent, forthcoming, and motivated for change.   Support System: Family.   Client Treatment Preferences Outpatient Therapy.   Client Statement of Needs Nasreen would like to improve motivation (ex. Household tasks), managing anger related to grief and lack of charges for murdered fiancee, spending less time with family who continue to bring up the loss, boundaries for others, processing grief, bolstering coping skills, reestablish daily routine, boundaries for self, managing rumination, and improving focus on self-care.   Treatment Level Weekly  Symptoms  Anxiety: feeling anxious, difficulty managing worry, worrying about different things, trouble relaxing, restless, irritability, feeling afraid something awful might happen.    (Status: maintained) Depression:  Loss of interest, feeling down, poor sleep, lethargy, fluctuating appetite,    (Status: maintained)  Goals:   Kosisoch[place]wu experiences  symptoms of depression, anxiety, and grief.    Target Date: 05/21/23 Frequency: Weekly  Progress: 0 Modality: individual    Therapist will provide referrals for additional resources as appropriate.  Therapist will provide psycho-education regarding Natiya's diagnosis and corresponding treatment approaches and interventions. Licensed Clinical Social Worker, [place], LCSW will support the patient's ability to achieve the goals identified. will employ CBT, BA, Problem-solving, Solution Focused, Mindfulness,  coping skills, & other evidenced-based practices will be used to promote progress towards healthy functioning to help manage decrease symptoms associated with her diagnosis.   Reduce overall level, frequency, and intensity of the feelings of depression, anxiety and panic evidenced by decreased overall symptoms from 6 to 7 days/week to 0 to 1 days/week per client report for at least 3 consecutive months. Verbally express understanding of the relationship between feelings of depression, anxiety and their impact on thinking patterns and behaviors. Verbalize an understanding of the role that distorted thinking plays in creating fears, excessive worry, and ruminations.    Cloyd Stagers participated in the creation of the treatment plan)  Delight Ovens, LCSW

## 2022-07-01 ENCOUNTER — Ambulatory Visit: Payer: Commercial Managed Care - PPO | Admitting: Family Medicine

## 2022-07-01 VITALS — BP 121/79 | HR 99 | Temp 98.6°F | Ht 64.0 in | Wt 189.5 lb

## 2022-07-01 DIAGNOSIS — F4322 Adjustment disorder with anxiety: Secondary | ICD-10-CM | POA: Diagnosis not present

## 2022-07-01 DIAGNOSIS — F411 Generalized anxiety disorder: Secondary | ICD-10-CM

## 2022-07-01 DIAGNOSIS — F325 Major depressive disorder, single episode, in full remission: Secondary | ICD-10-CM

## 2022-07-01 DIAGNOSIS — F3342 Major depressive disorder, recurrent, in full remission: Secondary | ICD-10-CM | POA: Diagnosis not present

## 2022-07-01 DIAGNOSIS — I10 Essential (primary) hypertension: Secondary | ICD-10-CM | POA: Diagnosis not present

## 2022-07-01 NOTE — Patient Instructions (Addendum)
Keep the diet clean and stay active.  Please consider adding some weight resistance exercise to your routine. Consider yoga as well.   Try not to scratch as this can make things worse. Avoid scented products while dealing with this. You may resume when the itchiness resolves. Cold/cool compresses can help.   Let Stacey Conner know if you need anything.

## 2022-07-01 NOTE — Progress Notes (Signed)
Chief Complaint  Patient presents with   Follow-up    6 month Blood pressure concern    Subjective Stacey Conner presents for f/u anxiety/depression.  Pt is currently being treated with Cymbalta 60 mg/d.  Reports doing well since treatment. No thoughts of harming self or others. No self-medication with alcohol, prescription drugs or illicit drugs. Pt is following with a counselor/psychologist.  Hypertension Patient presents for hypertension follow up. She does monitor home blood pressures. Blood pressures ranging on average from 120-130's/70's. She is compliant with medication- Norvasc 10 mg/d. Patient has these side effects of medication: none She is usually adhering to a healthy diet overall. Exercise: walking No Cp or SOB.   Past Medical History:  Diagnosis Date   Allergy    Frequent headaches    Migraine    Thyroid disease    Vaginal Pap smear, abnormal 2006   Allergies as of 07/01/2022       Reactions   Sulfa Antibiotics         Medication List        Accurate as of July 01, 2022  9:08 AM. If you have any questions, ask your nurse or doctor.          STOP taking these medications    busPIRone 7.5 MG tablet Commonly known as: BUSPAR Stopped by: Stacey Dory, DO   naproxen 500 MG EC tablet Commonly known as: EC NAPROSYN Stopped by: Stacey Dory, DO   propranolol 10 MG tablet Commonly known as: INDERAL Stopped by: Stacey Dory, DO       TAKE these medications    amLODipine 10 MG tablet Commonly known as: NORVASC Take 1 tablet (10 mg total) by mouth daily.   DULoxetine 60 MG capsule Commonly known as: Cymbalta Take 1 capsule (60 mg total) by mouth daily.   Tarina 24 Fe 1-20 MG-MCG(24) tablet Generic drug: Norethindrone Acetate-Ethinyl Estrad-FE Take 1 tablet by mouth daily.        Exam BP 121/79 (BP Location: Left Arm, Patient Position: Sitting, Cuff Size: Normal)   Pulse 99   Temp 98.6 F (37 C)  (Oral)   Ht 5\' 4"  (1.626 m)   Wt 189 lb 8 oz (86 kg)   SpO2 97%   BMI 32.53 kg/m  General:  well developed, well nourished, in no apparent distress Heart: RRR Lungs: CTAB. No respiratory distress Psych: well oriented with normal range of affect and age-appropriate judgement/insight, alert and oriented x4.  Assessment and Plan  GAD (generalized anxiety disorder)  Depression, major, single episode, complete remission (HCC)  Primary hypertension  1/2. Chronic, stable. Cont Cymbalta 60 mg/d. 3. Chronic, stable. Cont Norvasc 10 mg/d.  F/u in 6 mo. The patient voiced understanding and agreement to the plan.  Jilda Roche [place], DO 07/01/22 9:08 AM

## 2022-07-13 ENCOUNTER — Ambulatory Visit (INDEPENDENT_AMBULATORY_CARE_PROVIDER_SITE_OTHER): Payer: Commercial Managed Care - PPO | Admitting: Psychology

## 2022-07-13 DIAGNOSIS — F411 Generalized anxiety disorder: Secondary | ICD-10-CM

## 2022-07-13 DIAGNOSIS — F325 Major depressive disorder, single episode, in full remission: Secondary | ICD-10-CM

## 2022-07-13 NOTE — Progress Notes (Signed)
[facility] Counselor/Therapist Progress Note  Patient ID: Stacey Conner, MRN: [id number]   Date: 07/13/22  Time Spent: 9:03  am - 9:48 am : 45 Minutes  Treatment Type: Individual Therapy.  Reported Symptoms: Depression and anxiety.   Mental Status Exam: Appearance:  Neat     Behavior: Appropriate  Motor: Normal  Speech/Language:  Clear and Coherent  Affect: Depressed  Mood: depressed  Thought process: normal  Thought content:   WNL  Sensory/Perceptual disturbances:   WNL  Orientation: oriented to person, place, time/date, and situation  Attention: Good  Concentration: Good  Memory: WNL  Fund of knowledge:  Good  Insight:   Good  Judgment:  Good  Impulse Control: Good   Risk Assessment: Danger to Self:  No Self-injurious Behavior: No Danger to Others: No Duty to Warn:no Physical Aggression / Violence:No  Access to Firearms a concern: No  Gang Involvement:No   Subjective:   Stacey Conner participated from home, via video, is aware of the limitations of tele-sessions, and consented to treatment. Therapist participated from office. Stacey Conner reviewed the events of the past week. She noted working on setting time a side, on a daily basis, to grieve. She noted this going well, overall, but noted having to keep herself busy. She noted crying less and noted a month ago crying on a daily basis. She noted holiday's being difficult, due to missing her fiancee during celebrations, and noted breaking down at work as a result. She noted this being a trigger. Additional triggers include music, specific photographs, people checking in regarding loss, celebrations, general life happenings, his family reminiscing, specific locations, reliving receiving notice of partner's murder, traveling. Therapist encouraged Stacey Conner to identify ways to manage her distress and we reviewed her coping skills including breathing, relaxation, and using healthy distractions. Therapist encouraged Stacey Conner  to consider a grief-group and resources were previously provided. She is open to this. Therapist praised Dentist for her effort and energy and provided supportive therapy. A follow-up was scheduled for continued treatment.   Interventions: CBT and BA.    Diagnosis:  Depression, major, single episode, complete remission (HCC)  GAD (generalized anxiety disorder)  Psychiatric Treatment: Yes , via PCP.    Treatment Plan:  Client Abilities/Strengths Stacey Conner is intelligent, forthcoming, and motivated for change.   Support System: Family.   Client Treatment Preferences Outpatient Therapy.   Client Statement of Needs Stacey Conner would like to improve motivation (ex. Household tasks), managing anger related to grief and lack of charges for murdered fiancee, spending less time with family who continue to bring up the loss, boundaries for others, processing grief, bolstering coping skills, reestablish daily routine, boundaries for self, managing rumination, and improving focus on self-care.   Treatment Level Weekly  Symptoms  Anxiety: feeling anxious, difficulty managing worry, worrying about different things, trouble relaxing, restless, irritability, feeling afraid something awful might happen.    (Status: maintained) Depression: Loss of interest, feeling down, poor sleep, lethargy, fluctuating appetite,    (Status: maintained)  Goals:   Stacey Conner experiences symptoms of depression, anxiety, and grief.    Target Date: 05/21/23 Frequency: Weekly  Progress: 0 Modality: individual    Therapist will provide referrals for additional resources as appropriate.  Therapist will provide psycho-education regarding Jarelyn's diagnosis and corresponding treatment approaches and interventions. Licensed Clinical Social Worker, [place], LCSW will support the patient's ability to achieve the goals identified. will employ CBT, BA, Problem-solving, Solution Focused, Mindfulness,  coping skills, & other  evidenced-based practices will be used to  promote progress towards healthy functioning to help manage decrease symptoms associated with her diagnosis.   Reduce overall level, frequency, and intensity of the feelings of depression, anxiety and panic evidenced by decreased overall symptoms from 6 to 7 days/week to 0 to 1 days/week per client report for at least 3 consecutive months. Verbally express understanding of the relationship between feelings of depression, anxiety and their impact on thinking patterns and behaviors. Verbalize an understanding of the role that distorted thinking plays in creating fears, excessive worry, and ruminations.    Stacey Conner participated in the creation of the treatment plan)  Stacey Ovens, LCSW

## 2022-07-27 ENCOUNTER — Ambulatory Visit (INDEPENDENT_AMBULATORY_CARE_PROVIDER_SITE_OTHER): Payer: Commercial Managed Care - PPO | Admitting: Psychology

## 2022-07-27 DIAGNOSIS — F411 Generalized anxiety disorder: Secondary | ICD-10-CM

## 2022-07-27 DIAGNOSIS — F325 Major depressive disorder, single episode, in full remission: Secondary | ICD-10-CM

## 2022-07-27 NOTE — Progress Notes (Signed)
[facility] Counselor/Therapist Progress Note  Patient ID: Stacey Conner, MRN: [id number]   Date: 07/27/22  Time Spent: 9:02  am - 9:53 am :51 Minutes  Treatment Type: Individual Therapy.  Reported Symptoms: Depression and anxiety.   Mental Status Exam: Appearance:  Neat     Behavior: Appropriate  Motor: Normal  Speech/Language:  Clear and Coherent  Affect: Depressed  Mood: depressed  Thought process: normal  Thought content:   WNL  Sensory/Perceptual disturbances:   WNL  Orientation: oriented to person, place, time/date, and situation  Attention: Good  Concentration: Good  Memory: WNL  Fund of knowledge:  Good  Insight:   Good  Judgment:  Good  Impulse Control: Good   Risk Assessment: Danger to Self:  No Self-injurious Behavior: No Danger to Others: No Duty to Warn:no Physical Aggression / Violence:No  Access to Firearms a concern: No  Gang Involvement:No   Subjective:   Stacey Conner participated from home, via video, is aware of the limitations of tele-sessions, and consented to treatment. Therapist participated from office. Stacey Conner reviewed the events of the past week. She noted her feelings being hurt as a result of being left out by the in-laws and noted providing feedback regarding this. She noted this going well. She noted working on prioritizing self including exercising at work and home, getting her hair and nails done, and eating healthfully. 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 She noted she would get an AA degree. She noted her hope to being Summer 2025. We discussed possible changes in her routine to support her examinations and school. We worked on identifying past difficulties with academics including unexpected life events, work stressors, unexpected schedule changes with work, interpersonal stressors, grief. We discussed the numerous  adjustments that she would have to make to support academics and work schedules. She noted needing space from her in-laws and noted that they are often tearful when she visits and noted that she often becomes tearful herself. She noted the lack of progress in her in-laws' grief and how this affects her.  We continued to explored this and possible boundaries going forward. She endorsed infrequent disturbed sleep but denied any other occurrence in the past ~2.5 weeks. She noted typically sleeping for 6.5-7 hours of sleep. She noted the possibility of caffeine affecting her sleep. We discussed the importance of being mindful of caffeine intake going forward and reducing caffeine intake before sleep. Psycho-education regarding sleep and stimulant use (caffeine and nicotine) was provided durin the session. She noted quitting cigarettes for the past two weeks. She is working on quitting the vaping. Therapist praised Dentist for her effort during the session and commitment to make small changes aligning with her goals. Therapist validated Stacey Conner's feelings and provided supportive therapy.    Interventions: CBT and BA.    Diagnosis:  Depression, major, single episode, complete remission (HCC)  GAD (generalized anxiety disorder)  Psychiatric Treatment: Yes , via PCP.    Treatment Plan:  Client Abilities/Strengths Stacey Conner is intelligent, forthcoming, and motivated for change.   Support System: Family.   Client Treatment Preferences Outpatient Therapy.   Client Statement of Needs Stacey Conner would like to improve motivation (ex. Household tasks), managing anger related to grief and lack of charges for murdered fiancee, spending less time with family who continue to bring up the loss, boundaries for others, processing grief, bolstering coping skills, reestablish  daily routine, boundaries for self, managing rumination, and improving focus on self-care.   Treatment Level Weekly  Symptoms  Anxiety: feeling  anxious, difficulty managing worry, worrying about different things, trouble relaxing, restless, irritability, feeling afraid something awful might happen.    (Status: maintained) Depression: Loss of interest, feeling down, poor sleep, lethargy, fluctuating appetite,    (Status: maintained)  Goals:   Concha experiences symptoms of depression, anxiety, and grief.    Target Date: 05/21/23 Frequency: Weekly  Progress: 0 Modality: individual    Therapist will provide referrals for additional resources as appropriate.  Therapist will provide psycho-education regarding Stacey Conner's diagnosis and corresponding treatment approaches and interventions. Licensed Clinical Social Worker, [place], LCSW will support the patient's ability to achieve the goals identified. will employ CBT, BA, Problem-solving, Solution Focused, Mindfulness,  coping skills, & other evidenced-based practices will be used to promote progress towards healthy functioning to help manage decrease symptoms associated with her diagnosis.   Reduce overall level, frequency, and intensity of the feelings of depression, anxiety and panic evidenced by decreased overall symptoms from 6 to 7 days/week to 0 to 1 days/week per client report for at least 3 consecutive months. Verbally express understanding of the relationship between feelings of depression, anxiety and their impact on thinking patterns and behaviors. Verbalize an understanding of the role that distorted thinking plays in creating fears, excessive worry, and ruminations.    Stacey Conner participated in the creation of the treatment plan)  Stacey Ovens, LCSW

## 2022-08-10 ENCOUNTER — Ambulatory Visit (INDEPENDENT_AMBULATORY_CARE_PROVIDER_SITE_OTHER): Payer: Commercial Managed Care - PPO | Admitting: Psychology

## 2022-08-10 DIAGNOSIS — F411 Generalized anxiety disorder: Secondary | ICD-10-CM | POA: Diagnosis not present

## 2022-08-10 DIAGNOSIS — F325 Major depressive disorder, single episode, in full remission: Secondary | ICD-10-CM | POA: Diagnosis not present

## 2022-08-10 NOTE — Progress Notes (Signed)
[facility] Counselor/Therapist Progress Note  Patient ID: Stacey Conner, MRN: [id number]   Date: 08/10/22  Time Spent: 9:03  am - 9:53 am :50 Minutes  Treatment Type: Individual Therapy.  Reported Symptoms: Depression and anxiety.   Mental Status Exam: Appearance:  Neat     Behavior: Appropriate  Motor: Normal  Speech/Language:  Clear and Coherent  Affect: Depressed  Mood: depressed  Thought process: normal  Thought content:   WNL  Sensory/Perceptual disturbances:   WNL  Orientation: oriented to person, place, time/date, and situation  Attention: Good  Concentration: Good  Memory: WNL  Fund of knowledge:  Good  Insight:   Good  Judgment:  Good  Impulse Control: Good   Risk Assessment: Danger to Self:  No Self-injurious Behavior: No Danger to Others: No Duty to Warn:no Physical Aggression / Violence:No  Access to Firearms a concern: No  Gang Involvement:No   Subjective:   Stacey Conner participated from home, via video, is aware of the limitations of tele-sessions, and consented to treatment. Therapist participated from office. Stacey Conner noted continued grief regarding the loss of her fiancee but noted that she has made progress in this area and noted feeling stronger. She noted therapy being helpful and working on addressing her needs daily. She noted gaining a level of acceptance of the loss. And noted working on [employer]. She noted making progress in day-to-day engagement. She noted more effort socializing and this feeling more like it has before. She noted previously feeling guilty to enjoy her time after his passing. She noted continuing to be tearful but at a lesser frequency and duration. She noted a recent familial stressor and we continued to explore this during the session. She noted wondering how to cope with sad thoughts about his fiance. She noted reduction of caffeine and noted better sleep, as a result. She continues to exercise via walking and  the recumbent bicycle. Therapist praised Stacey Conner for her effort to make behavioral changes to support a good mood. She noted her father recently broke his hip after a fall. She noted worry regarding her father and hoping for a speed recovery. Therapist validated and normalized Stacey Conner's experience and feelings and provided supportive therapy. A follow-up was scheduled for continued treatment.   Interventions: CBT and BA.    Diagnosis:  Depression, major, single episode, complete remission (HCC)  GAD (generalized anxiety disorder)  Psychiatric Treatment: Yes , via PCP.    Treatment Plan:  Client Abilities/Strengths Stacey Conner is intelligent, forthcoming, and motivated for change.   Support System: Family.   Client Treatment Preferences Outpatient Therapy.   Client Statement of Needs Stacey Conner would like to improve motivation (ex. Household tasks), managing anger related to grief and lack of charges for murdered fiancee, spending less time with family who continue to bring up the loss, boundaries for others, processing grief, bolstering coping skills, reestablish daily routine, boundaries for self, managing rumination, and improving focus on self-care.   Treatment Level Weekly  Symptoms  Anxiety: feeling anxious, difficulty managing worry, worrying about different things, trouble relaxing, restless, irritability, feeling afraid something awful might happen.    (Status: maintained) Depression: Loss of interest, feeling down, poor sleep, lethargy, fluctuating appetite,    (Status: maintained)  Goals:   Stacey Conner experiences symptoms of depression, anxiety, and grief.    Target Date: 05/21/23 Frequency: Weekly  Progress: 0 Modality: individual    Therapist will provide referrals for additional resources as appropriate.  Therapist will provide psycho-education regarding Stacey Conner's diagnosis and corresponding treatment  approaches and interventions. Licensed Clinical Social Worker, [place], LCSW will support the patient's ability to achieve the goals identified. will employ CBT, BA, Problem-solving, Solution Focused, Mindfulness,  coping skills, & other evidenced-based practices will be used to promote progress towards healthy functioning to help manage decrease symptoms associated with her diagnosis.   Reduce overall level, frequency, and intensity of the feelings of depression, anxiety and panic evidenced by decreased overall symptoms from 6 to 7 days/week to 0 to 1 days/week per client report for at least 3 consecutive months. Verbally express understanding of the relationship between feelings of depression, anxiety and their impact on thinking patterns and behaviors. Verbalize an understanding of the role that distorted thinking plays in creating fears, excessive worry, and ruminations.    Stacey Conner participated in the creation of the treatment plan)  Delight Ovens, LCSW

## 2022-08-27 ENCOUNTER — Encounter: Payer: Commercial Managed Care - PPO | Admitting: Psychology

## 2022-08-27 DIAGNOSIS — F411 Generalized anxiety disorder: Secondary | ICD-10-CM

## 2022-08-27 DIAGNOSIS — F325 Major depressive disorder, single episode, in full remission: Secondary | ICD-10-CM

## 2022-08-27 NOTE — Progress Notes (Signed)
This encounter was created in error - please disregard.

## 2022-08-31 ENCOUNTER — Other Ambulatory Visit: Payer: Self-pay

## 2022-09-08 ENCOUNTER — Ambulatory Visit (INDEPENDENT_AMBULATORY_CARE_PROVIDER_SITE_OTHER): Payer: Commercial Managed Care - PPO | Admitting: Psychology

## 2022-09-08 DIAGNOSIS — F411 Generalized anxiety disorder: Secondary | ICD-10-CM | POA: Diagnosis not present

## 2022-09-08 DIAGNOSIS — F325 Major depressive disorder, single episode, in full remission: Secondary | ICD-10-CM

## 2022-09-08 NOTE — Progress Notes (Signed)
[facility] Counselor/Therapist Progress Note  Patient ID: Stacey Conner, MRN: [id number]   Date: 09/08/22  Time Spent: 10:06 am - 10:51 am :45 Minutes  Treatment Type: Individual Therapy.  Reported Symptoms: Depression and anxiety.   Mental Status Exam: Appearance:  Neat     Behavior: Appropriate  Motor: Normal  Speech/Language:  Clear and Coherent  Affect: Depressed  Mood: depressed  Thought process: normal  Thought content:   WNL  Sensory/Perceptual disturbances:   WNL  Orientation: oriented to person, place, time/date, and situation  Attention: Good  Concentration: Good  Memory: WNL  Fund of knowledge:  Good  Insight:   Good  Judgment:  Good  Impulse Control: Good   Risk Assessment: Danger to Self:  No Self-injurious Behavior: No Danger to Others: No Duty to Warn:no Physical Aggression / Violence:No  Access to Firearms a concern: No  Gang Involvement:No   Subjective:   Wynonia Hazard participated from home, via video, is aware of the limitations of tele-sessions, and consented to treatment. Therapist participated from office. Lily discussed the events of the past week. She noted a need to work on expressing anger and being upset in different ways. She noted noticing more anger and noted "why did he put himself before his family, knowing there would be trouble and still walking into it" and noted difficulty understanding his choices in relation to her deceased ex-fiance. She described him as overprotective of others. She noted having to accept that he was who he was and what decision he made. We worked on processing this. She noted wondering if she is "taking too long" to grieve in relation to duration. She noted wondering if her grief "will always be this pronounced"? She noted feeling of guilt for doing activities without him. We worked on identifying her expectations regarding grieving during the session. She discussed her own experience with grief but  noted progress in her overall mood. She noted worry about the upcoming holidays and "how they will be". Therapist encouraged Claud Kelp to identifying what she would like these holidays to be like. We will work on processing this going forward. She noted decreasing her medication Duloxetine 60 mg and propanolol 10. Therapist validated and normalized Jenese's feelings and experience and provided supportive therapy. A follow-up was scheduled for continued treatment.   Interventions: CBT and grief  Diagnosis:  Depression, major, single episode, complete remission (HCC)  GAD (generalized anxiety disorder)  Psychiatric Treatment: Yes , via PCP.    Treatment Plan:  Client Abilities/Strengths Annarose is intelligent, forthcoming, and motivated for change.   Support System: Family.   Client Treatment Preferences Outpatient Therapy.   Client Statement of Needs Zanari would like to improve motivation (ex. Household tasks), managing anger related to grief and lack of charges for murdered fiancee, spending less time with family who continue to bring up the loss, boundaries for others, processing grief, bolstering coping skills, reestablish daily routine, boundaries for self, managing rumination, and improving focus on self-care.   Treatment Level Weekly  Symptoms  Anxiety: feeling anxious, difficulty managing worry, worrying about different things, trouble relaxing, restless, irritability, feeling afraid something awful might happen.    (Status: maintained) Depression: Loss of interest, feeling down, poor sleep, lethargy, fluctuating appetite,    (Status: maintained)  Goals:   Jewelene experiences symptoms of depression, anxiety, and grief.    Target Date: 05/21/23 Frequency: Weekly  Progress: 0 Modality: individual    Therapist will provide referrals for additional resources as appropriate.  Therapist will provide  psycho-education regarding Hallee's diagnosis and corresponding treatment  approaches and interventions. Licensed Clinical Social Worker, [place], LCSW will support the patient's ability to achieve the goals identified. will employ CBT, BA, Problem-solving, Solution Focused, Mindfulness,  coping skills, & other evidenced-based practices will be used to promote progress towards healthy functioning to help manage decrease symptoms associated with her diagnosis.   Reduce overall level, frequency, and intensity of the feelings of depression, anxiety and panic evidenced by decreased overall symptoms from 6 to 7 days/week to 0 to 1 days/week per client report for at least 3 consecutive months. Verbally express understanding of the relationship between feelings of depression, anxiety and their impact on thinking patterns and behaviors. Verbalize an understanding of the role that distorted thinking plays in creating fears, excessive worry, and ruminations.    Cloyd Stagers participated in the creation of the treatment plan)  Delight Ovens, LCSW

## 2022-09-22 ENCOUNTER — Ambulatory Visit (INDEPENDENT_AMBULATORY_CARE_PROVIDER_SITE_OTHER): Payer: Commercial Managed Care - PPO | Admitting: Psychology

## 2022-09-22 DIAGNOSIS — F411 Generalized anxiety disorder: Secondary | ICD-10-CM

## 2022-09-22 DIAGNOSIS — F325 Major depressive disorder, single episode, in full remission: Secondary | ICD-10-CM | POA: Diagnosis not present

## 2022-09-22 NOTE — Progress Notes (Signed)
[facility] Counselor/Therapist Progress Note  Patient ID: Stacey Conner, MRN: [id number]   Date: 09/22/22  Time Spent: 10:04 am - 10:51  am :46 Minutes  Treatment Type: Individual Therapy.  Reported Symptoms: Depression and anxiety.   Mental Status Exam: Appearance:  Neat     Behavior: Appropriate  Motor: Normal  Speech/Language:  Clear and Coherent  Affect: Congruent  Mood: dysthymic  Thought process: normal  Thought content:   WNL  Sensory/Perceptual disturbances:   WNL  Orientation: oriented to person, place, time/date, and situation  Attention: Good  Concentration: Good  Memory: WNL  Fund of knowledge:  Good  Insight:   Good  Judgment:  Good  Impulse Control: Good   Risk Assessment: Danger to Self:  No Self-injurious Behavior: No Danger to Others: No Duty to Warn:no Physical Aggression / Violence:No  Access to Firearms a concern: No  Gang Involvement:No   Subjective:   Stacey Conner participated from home, via video, is aware of the limitations of tele-sessions, and consented to treatment. Therapist participated from office. 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 Therapist encouraged [name] to do research on this and to ensure that she follows instructions and maintain safety. She noted her interest in changing jobs and noted that this would increase her pay significantly. We explored this possible change. She noted this being the six month anniversary of her fiance's murder. We reflected on her expectations of the 6 month anniversary. She noted feeling less angry, at this point, than she imagined. She noted a sense of pride of "where she is". She noted learning acceptance. She noted continued grief and sadness but noted this being more manageable. She noted often thinking "what if?". We explored this during the session. She noted wanting to memorialize the he fiance's passing. Therapist encouraged [name] to  do research online and to ensure that this is healthy, sustainable, and meaningful. She noted having to set boundaries with in-laws and noted this going well, overall. Therapist praised [name] for her effort in this area.  Therapist validated and normalized Brionne's feelings and experience and provided supportive therapy. A follow-up was scheduled for continued treatment which she benefits from.   Interventions: CBT and grief  Diagnosis:  Depression, major, single episode, complete remission (HCC)  GAD (generalized anxiety disorder)  Psychiatric Treatment: Yes , via PCP.    Treatment Plan:  Client Abilities/Strengths Ronja is intelligent, forthcoming, and motivated for change.   Support System: Family.   Client Treatment Preferences Outpatient Therapy.   Client Statement of Needs [name] would like to improve motivation (ex. Household tasks), managing anger related to grief and lack of charges for murdered fiancee, spending less time with family who continue to bring up the loss, boundaries for others, processing grief, bolstering coping skills, reestablish daily routine, boundaries for self, managing rumination, and improving focus on self-care.   Treatment Level Weekly  Symptoms  Anxiety: feeling anxious, difficulty managing worry, worrying about different things, trouble relaxing, restless, irritability, feeling afraid something awful might happen.    (Status: maintained) Depression: Loss of interest, feeling down, poor sleep, lethargy, fluctuating appetite,    (Status: maintained)  Goals:   [name] experiences symptoms of depression, anxiety, and grief.    Target Date: 05/21/23 Frequency: Weekly  Progress: 0 Modality: individual    Therapist will provide referrals for additional resources as appropriate.  Therapist will provide psycho-education regarding [name]'s diagnosis and corresponding treatment approaches and interventions. Licensed Clinical Social  Worker, Delight Ovens, LCSW will support the patient's ability to achieve the goals identified. will employ CBT, BA, Problem-solving, Solution Focused, Mindfulness,  coping skills, & other evidenced-based practices will be used to promote progress towards healthy functioning to help manage decrease symptoms associated with her diagnosis.   Reduce overall level, frequency, and intensity of the feelings of depression, anxiety and panic evidenced by decreased overall symptoms from 6 to 7 days/week to 0 to 1 days/week per client report for at least 3 consecutive months. Verbally express understanding of the relationship between feelings of depression, anxiety and their impact on thinking patterns and behaviors. Verbalize an understanding of the role that distorted thinking plays in creating fears, excessive worry, and ruminations.    Cloyd Stagers participated in the creation of the treatment plan)  Delight Ovens, LCSW

## 2022-09-24 ENCOUNTER — Other Ambulatory Visit (HOSPITAL_COMMUNITY): Payer: Self-pay

## 2022-09-24 ENCOUNTER — Other Ambulatory Visit: Payer: Self-pay | Admitting: Family Medicine

## 2022-09-24 MED ORDER — DULOXETINE HCL 60 MG PO CPEP
60.0000 mg | ORAL_CAPSULE | Freq: Every day | ORAL | 1 refills | Status: DC
  Filled 2022-09-24: qty 90, 90d supply, fill #0
  Filled 2022-10-28 – 2023-01-11 (×3): qty 90, 90d supply, fill #1

## 2022-10-06 ENCOUNTER — Ambulatory Visit (INDEPENDENT_AMBULATORY_CARE_PROVIDER_SITE_OTHER): Payer: Commercial Managed Care - PPO | Admitting: Psychology

## 2022-10-06 DIAGNOSIS — F411 Generalized anxiety disorder: Secondary | ICD-10-CM

## 2022-10-06 DIAGNOSIS — F325 Major depressive disorder, single episode, in full remission: Secondary | ICD-10-CM

## 2022-10-06 NOTE — Progress Notes (Signed)
[facility] Counselor/Therapist Progress Note  Patient ID: Stacey Conner, MRN: [id number]   Date: 10/06/22  Time Spent: 10:02 am - 10:36  am :  34  Minutes  Treatment Type: Individual Therapy.  Reported Symptoms: Depression and anxiety.   Mental Status Exam: Appearance:  Neat     Behavior: Appropriate  Motor: Normal  Speech/Language:  Clear and Coherent  Affect: Congruent  Mood: normal  Thought process: normal  Thought content:   WNL  Sensory/Perceptual disturbances:   WNL  Orientation: oriented to person, place, time/date, and situation  Attention: Good  Concentration: Good  Memory: WNL  Fund of knowledge:  Good  Insight:   Good  Judgment:  Good  Impulse Control: Good   Risk Assessment: Danger to Self:  No Self-injurious Behavior: No Danger to Others: No Duty to Warn:no Physical Aggression / Violence:No  Access to Firearms a concern: No  Gang Involvement:No   Subjective:   Wynonia Hazard participated from home, via video, is aware of the limitations of tele-sessions, and consented to treatment. Therapist participated from office. She noted being excited about her upcoming birthday celebration. 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 She noted it being six months since her partner passed. She noted being able to recall more positive memories than before. She noted her efforts to get back to her normal. She noted progress in her mood and functioning across setting. She is working on focusing on self after building a level of comfort in this area. She noted previously feeling scared about "being lonely" in relation to companionship but noted having support otherwise which she identified after some processing during the session. We worked on processing her upcoming transition, starting a new job, and noted maintaining 1 shift a week at her current job. She noted her hope to get go back to school for an  RN degree and an NP degree. She noted starting to study for the entrance exam. We discussed the creation of a schedule for her goals that would aid in maintaining balance day-to-day. Terez was receptive to this. She discussed her interest in shifting the frequency of sessions. We will discuss this further during our follow-up. Alexsis was engaged and motivated during the session. She expressed commitment towards goals.Therapist praised Crane and provided supportive therapy. A follow-up was scheduled.     Diagnosis:  Depression, major, single episode, complete remission (HCC)  GAD (generalized anxiety disorder)  Psychiatric Treatment: Yes , via PCP.    Treatment Plan:  Client Abilities/Strengths Aryianna is intelligent, forthcoming, and motivated for change.   Support System: Family.   Client Treatment Preferences Outpatient Therapy.   Client Statement of Needs Savahanna would like to improve motivation (ex. Household tasks), managing anger related to grief and lack of charges for murdered fiancee, spending less time with family who continue to bring up the loss, boundaries for others, processing grief, bolstering coping skills, reestablish daily routine, boundaries for self, managing rumination, and improving focus on self-care.   Treatment Level Weekly  Symptoms  Anxiety: feeling anxious, difficulty managing worry, worrying about different things, trouble relaxing, restless, irritability, feeling afraid something awful might happen.    (Status: improved) Depression: Loss of interest, feeling down, poor sleep, lethargy, fluctuating appetite,    (Status: improved)  Goals:   Brea experiences symptoms of depression, anxiety, and grief.    Target Date: 05/21/23 Frequency: Weekly  Progress: 40% Modality: individual  Therapist will provide referrals for additional resources as appropriate.  Therapist will provide psycho-education regarding Jazmyne's diagnosis and corresponding  treatment approaches and interventions. Licensed Clinical Social Worker, [place], LCSW will support the patient's ability to achieve the goals identified. will employ CBT, BA, Problem-solving, Solution Focused, Mindfulness,  coping skills, & other evidenced-based practices will be used to promote progress towards healthy functioning to help manage decrease symptoms associated with her diagnosis.   Reduce overall level, frequency, and intensity of the feelings of depression, anxiety and panic evidenced by decreased overall symptoms from 6 to 7 days/week to 0 to 1 days/week per client report for at least 3 consecutive months. Verbally express understanding of the relationship between feelings of depression, anxiety and their impact on thinking patterns and behaviors. Verbalize an understanding of the role that distorted thinking plays in creating fears, excessive worry, and ruminations.    Cloyd Stagers participated in the creation of the treatment plan)  Delight Ovens, LCSW

## 2022-10-14 ENCOUNTER — Other Ambulatory Visit (HOSPITAL_COMMUNITY): Payer: Self-pay

## 2022-10-19 ENCOUNTER — Ambulatory Visit: Payer: Commercial Managed Care - PPO | Admitting: Psychology

## 2022-10-19 DIAGNOSIS — F411 Generalized anxiety disorder: Secondary | ICD-10-CM

## 2022-10-19 DIAGNOSIS — F325 Major depressive disorder, single episode, in full remission: Secondary | ICD-10-CM | POA: Diagnosis not present

## 2022-10-19 NOTE — Progress Notes (Signed)
[facility] Counselor/Therapist Progress Note  Patient ID: Leanda Padmore, MRN: [id number]   Date: 10/19/22  Time Spent: 3:30 pm - 3:53  pm :  23 Minutes  Treatment Type: Individual Therapy.  Reported Symptoms: Depression and anxiety.   Mental Status Exam: Appearance:  Neat     Behavior: Appropriate  Motor: Normal  Speech/Language:  Clear and Coherent  Affect: Congruent  Mood: normal  Thought process: normal  Thought content:   WNL  Sensory/Perceptual disturbances:   WNL  Orientation: oriented to person, place, time/date, and situation  Attention: Good  Concentration: Good  Memory: WNL  Fund of knowledge:  Good  Insight:   Good  Judgment:  Good  Impulse Control: Good   Risk Assessment: Danger to Self:  No Self-injurious Behavior: No Danger to Others: No Duty to Warn:no Physical Aggression / Violence:No  Access to Firearms a concern: No  Gang Involvement:No   In case of a mental health emergency:  63 - confidential suicide hotline. [facility]        [address] [place], [place] [address]       [phone] 3.   911  4.   Visiting Nearest ED.   Subjective:   Wynonia Hazard participated from home, via video, is aware of the limitations of tele-sessions, and consented to treatment. Therapist participated from office. Hadyn noted celebrating her birthday and enjoying the experience, overall. Therapist invited Cloyd Stagers to idenitfy progress she has made during treatment. She noted thinking more positively, improving her frustration tolerance, improving boundaries and self awareness, becoming more physically active, identifying feelings. We worked on processing grief during our past session and Emmajean noted marked improvement in this area and noted learning more about the stages of grief. She discussed her progress towards the acceptance of the loss. We reviewed her coping during the session including being more  patient, seeing positives, using the appropriate coping tool for the stressors. We discussed consistency in self-care and mood management including check-ins day-to-day for mood and for self-care on a weekly basis. We reviewed safety, for reference, should the need arise. Please see above for details. She denied any safety concerns during the session. Therapist praised Dentist for her effort during the session. Zaleah will contact provider, should the need arise, for booster sessions and has the office contact information.  Diagnosis:  No diagnosis found.  Psychiatric Treatment: Yes , via PCP.    Treatment Plan:  Client Abilities/Strengths Shelie is intelligent, forthcoming, and motivated for change.   Support System: Family.   Client Treatment Preferences Outpatient Therapy.   Client Statement of Needs Suprina would like to improve motivation (ex. Household tasks), managing anger related to grief and lack of charges for murdered fiancee, spending less time with family who continue to bring up the loss, boundaries for others, processing grief, bolstering coping skills, reestablish daily routine, boundaries for self, managing rumination, and improving focus on self-care.   Treatment Level Weekly  Symptoms  Anxiety: feeling anxious, difficulty managing worry, worrying about different things, trouble relaxing, restless, irritability, feeling afraid something awful might happen.    (Status: improved) Depression: Loss of interest, feeling down, poor sleep, lethargy, fluctuating appetite,    (Status: improved)  Goals:   Yvonnia experiences symptoms of depression, anxiety, and grief.    Target Date: 05/21/23 Frequency: Weekly  Progress: 60% Modality: individual    Therapist will provide referrals for additional resources as appropriate.  Therapist will provide psycho-education regarding Liadan's diagnosis and  corresponding treatment approaches and interventions. Licensed Clinical Social  Worker, [place], LCSW will support the patient's ability to achieve the goals identified. will employ CBT, BA, Problem-solving, Solution Focused, Mindfulness,  coping skills, & other evidenced-based practices will be used to promote progress towards healthy functioning to help manage decrease symptoms associated with her diagnosis.   Reduce overall level, frequency, and intensity of the feelings of depression, anxiety and panic evidenced by decreased overall symptoms from 6 to 7 days/week to 0 to 1 days/week per client report for at least 3 consecutive months. Verbally express understanding of the relationship between feelings of depression, anxiety and their impact on thinking patterns and behaviors. Verbalize an understanding of the role that distorted thinking plays in creating fears, excessive worry, and ruminations.    Cloyd Stagers participated in the creation of the treatment plan)  Delight Ovens, LCSW

## 2022-10-28 ENCOUNTER — Other Ambulatory Visit: Payer: Self-pay

## 2022-10-28 ENCOUNTER — Other Ambulatory Visit (HOSPITAL_COMMUNITY): Payer: Self-pay

## 2022-10-30 ENCOUNTER — Other Ambulatory Visit (HOSPITAL_COMMUNITY): Payer: Self-pay

## 2022-11-26 ENCOUNTER — Encounter: Payer: Self-pay | Admitting: Emergency Medicine

## 2022-11-26 ENCOUNTER — Other Ambulatory Visit: Payer: Self-pay

## 2022-11-26 ENCOUNTER — Other Ambulatory Visit (HOSPITAL_COMMUNITY): Payer: Self-pay

## 2022-11-26 ENCOUNTER — Ambulatory Visit
Admission: EM | Admit: 2022-11-26 | Discharge: 2022-11-26 | Disposition: A | Discharge status: Home / Self Care | Payer: Commercial Managed Care - PPO | Attending: Family Medicine | Admitting: Family Medicine

## 2022-11-26 DIAGNOSIS — L03115 Cellulitis of right lower limb: Secondary | ICD-10-CM

## 2022-11-26 MED ORDER — CEPHALEXIN 500 MG PO CAPS
500.0000 mg | ORAL_CAPSULE | Freq: Three times a day (TID) | ORAL | 0 refills | Status: AC
  Filled 2022-11-26: qty 21, 7d supply, fill #0

## 2022-11-26 NOTE — ED Provider Notes (Addendum)
[facility]    CSN: [id number] Arrival date & time: 11/26/22  1507      History   Chief Complaint Chief Complaint  Patient presents with   Abscess    HPI Stacey Conner is a 47 y.o. female.    Abscess  Patient is here for an abscess at the right thigh area.  She thought she got bit by something about 2 weeks ago, was itchy but didn't think anything of it.   Over the weekend it got bigger, slightly painful, but itches.  She thinks there is pus there.  Today she sat in the hot tub and it did go down slightly.  No fevers/chills.         Past Medical History:  Diagnosis Date   Allergy    Frequent headaches    Migraine    Thyroid disease    Vaginal Pap smear, abnormal 2006    Patient Active Problem List   Diagnosis Date Noted   Primary hypertension 07/01/2022   Depression, major, single episode, complete remission (HCC) 12/11/2019   GAD (generalized anxiety disorder) 12/11/2019   Obesity (BMI 30-39.9) 10/30/2019   Gastroesophageal reflux disease 04/05/2019   Anxiety and depression 02/24/2018    Past Surgical History:  Procedure Laterality Date   ECTOPIC PREGNANCY SURGERY  2016   LEEP  2006   TONSILLECTOMY  1984    OB History     Gravida  4   Para  1   Term  1   Preterm      AB  3   Living  1      SAB  1   IAB      Ectopic  2   Multiple      Live Births  1            Home Medications    Prior to Admission medications   Medication Sig Start Date End Date Taking? Authorizing Provider  amLODipine (NORVASC) 10 MG tablet Take 1 tablet (10 mg total) by mouth daily. 12/30/21  Yes Sharlene Dory, DO  cetirizine (ZYRTEC) 10 MG tablet Take 10 mg by mouth daily.   Yes [provider]  DULoxetine (CYMBALTA) 60 MG capsule Take 1 capsule (60 mg total) by mouth daily. 09/24/22  Yes Wendling, Jilda Roche, DO  Norethindrone Acetate-Ethinyl Estrad-FE (LOESTRIN 24 FE) 1-20 MG-MCG(24) tablet Take 1 tablet by  mouth daily. 12/30/21   Sharlene Dory, DO    Family History Family History  Problem Relation Age of Onset   Arthritis Mother    Depression Mother    Hyperlipidemia Mother    Hypertension Mother    Arthritis Father    Diabetes Father    Depression Father    Asthma Son     Social History Social History   Tobacco Use   Smoking status: Former    Types: Cigarettes   Smokeless tobacco: Never  Vaping Use   Vaping status: Every Day  Substance Use Topics   Alcohol use: Yes    Comment: occasional   Drug use: Never     Allergies   Sulfa antibiotics   Review of Systems Review of Systems  Constitutional: Negative.   HENT: Negative.    Respiratory: Negative.    Cardiovascular: Negative.   Gastrointestinal: Negative.   Skin:  Positive for wound.     Physical Exam Triage Vital Signs ED Triage Vitals  Encounter Vitals Group     BP 11/26/22 1602 132/79  Systolic BP Percentile --      Diastolic BP Percentile --      Pulse Rate 11/26/22 1602 74     Resp --      Temp 11/26/22 1602 98.1 F (36.7 C)     Temp src --      SpO2 11/26/22 1602 98 %     Weight 11/26/22 1606 188 lb (85.3 kg)     Height 11/26/22 1606 5\' 4"  (1.626 m)     Head Circumference --      Peak Flow --      Pain Score 11/26/22 1605 0     Pain Loc --      Pain Education --      Exclude from Growth Chart --    No data found.  Updated Vital Signs BP 132/79 (BP Location: Left Arm)   Pulse 74   Temp 98.1 F (36.7 C)   Ht 5\' 4"  (1.626 m)   Wt 85.3 kg   LMP 11/06/2022   SpO2 98%   BMI 32.27 kg/m   Visual Acuity Right Eye Distance:   Left Eye Distance:   Bilateral Distance:    Right Eye Near:   Left Eye Near:    Bilateral Near:     Physical Exam Constitutional:      Appearance: Normal appearance.  Skin:    Comments: At the right upper posterior thigh is an open lesion without drainage at this time.  There is surrounding warmth, erythema and fullness to the skin surrounding  this.   Neurological:     General: No focal deficit present.     Mental Status: She is alert.  Psychiatric:        Mood and Affect: Mood normal.      UC Treatments / Results  Labs (all labs ordered are listed, but only abnormal results are displayed) Labs Reviewed - No data to display  EKG   Radiology No results found.  Procedures Procedures (including critical care time)  Medications Ordered in UC Medications - No data to display  Initial Impression / Assessment and Plan / UC Course  I have reviewed the triage vital signs and the nursing notes.  Pertinent labs & imaging results that were available during my care of the patient were reviewed by me and considered in my medical decision making (see chart for details).   Final Clinical Impressions(s) / UC Diagnoses   Final diagnoses:  Cellulitis of right lower extremity     Discharge Instructions      You were seen today for cellulitis of the leg from possible insect bite.  I have sent out an antibiotic to take three times/day x 7 days.   Please continue warm compresses/soaks to help.  Return if not improving or worsening.     ED Prescriptions     Medication Sig Dispense Auth. Provider   cephALEXin (KEFLEX) 500 MG capsule Take 1 capsule (500 mg total) by mouth 3 (three) times daily for 7 days. 21 capsule Jannifer Franklin, MD      PDMP not reviewed this encounter.   Jannifer Franklin, MD 11/26/22 1620    Jannifer Franklin, MD 11/26/22 1626

## 2022-11-26 NOTE — Discharge Instructions (Addendum)
You were seen today for cellulitis of the leg from possible insect bite.  I have sent out an antibiotic to take three times/day x 7 days.   Please continue warm compresses/soaks to help.  Return if not improving or worsening.

## 2022-11-26 NOTE — ED Triage Notes (Addendum)
Pt reports abscess to L posterior thigh x6 day. Reports burning, itching sensation intermittently. Mild pain with friction from clothing. Pt reports swelling went down slightly with warm compress but then returned to original size.

## 2023-01-01 ENCOUNTER — Encounter: Payer: Commercial Managed Care - PPO | Admitting: Family Medicine

## 2023-01-07 ENCOUNTER — Other Ambulatory Visit (HOSPITAL_COMMUNITY): Payer: Self-pay

## 2023-01-11 ENCOUNTER — Other Ambulatory Visit (HOSPITAL_COMMUNITY): Payer: Self-pay

## 2023-01-25 ENCOUNTER — Other Ambulatory Visit: Payer: Self-pay | Admitting: Family Medicine

## 2023-01-25 ENCOUNTER — Other Ambulatory Visit (HOSPITAL_COMMUNITY): Payer: Self-pay

## 2023-01-25 MED ORDER — AMLODIPINE BESYLATE 10 MG PO TABS
10.0000 mg | ORAL_TABLET | Freq: Every day | ORAL | 3 refills | Status: AC
  Filled 2023-01-25: qty 90, 90d supply, fill #0
  Filled 2023-05-13: qty 90, 90d supply, fill #1

## 2023-02-26 ENCOUNTER — Encounter: Payer: Self-pay | Admitting: Family Medicine

## 2023-03-02 ENCOUNTER — Encounter: Payer: Self-pay | Admitting: Family Medicine

## 2023-03-05 ENCOUNTER — Ambulatory Visit (INDEPENDENT_AMBULATORY_CARE_PROVIDER_SITE_OTHER): Payer: BC Managed Care – PPO | Admitting: Family Medicine

## 2023-03-05 VITALS — BP 126/84 | HR 81 | Temp 98.6°F | Resp 20 | Ht 64.0 in | Wt 197.2 lb

## 2023-03-05 DIAGNOSIS — Z1211 Encounter for screening for malignant neoplasm of colon: Secondary | ICD-10-CM

## 2023-03-05 DIAGNOSIS — Z0000 Encounter for general adult medical examination without abnormal findings: Secondary | ICD-10-CM

## 2023-03-05 NOTE — Patient Instructions (Signed)
Give us 2-3 business days to get the results of your labs back.   Keep the diet clean and stay active.  If you do not hear anything about your referral in the next 1-2 weeks, call our office and ask for an update.  Please get me a copy of your advanced directive form at your convenience.   Let us know if you need anything. 

## 2023-03-05 NOTE — Progress Notes (Signed)
 Chief Complaint  Patient presents with   Annual Exam    Patient presents today for physical exam   Quality Metric Gaps    Pap, colonoscopy, COVID#4     Well Woman Stacey Conner is here for a complete physical.   Her last physical was >1 year ago.  Current diet: in general, diet is OK Current exercise: some walking. Weight is up a few lbs and she denies fatigue out of ordinary. Seatbelt? Yes Advanced directive? No  Health Maintenance Pap/HPV- Yes Mammogram- Yes Tetanus- Yes Hep C screening- Yes HIV screening- Yes CCS- No  Past Medical History:  Diagnosis Date   Allergy    Frequent headaches    Migraine    Thyroid disease    Vaginal Pap smear, abnormal 2006     Past Surgical History:  Procedure Laterality Date   ECTOPIC PREGNANCY SURGERY  2016   LEEP  2006   TONSILLECTOMY  1984    Medications  Current Outpatient Medications on File Prior to Visit  Medication Sig Dispense Refill   amLODipine (NORVASC) 10 MG tablet Take 1 tablet (10 mg total) by mouth daily. 90 tablet 3   cetirizine (ZYRTEC) 10 MG tablet Take 10 mg by mouth daily.     DULoxetine (CYMBALTA) 60 MG capsule Take 1 capsule (60 mg total) by mouth daily. 90 capsule 1   Norethindrone Acetate-Ethinyl Estrad-FE (LOESTRIN 24 FE) 1-20 MG-MCG(24) tablet Take 1 tablet by mouth daily. 84 tablet 3   Allergies Allergies  Allergen Reactions   Sulfa Antibiotics    Review of Systems: Constitutional:  no unexpected weight changes Eye:  no recent significant change in vision Ear/Nose/Mouth/Throat:  Ears:  no recent change in hearing Nose/Mouth/Throat:  no complaints of nasal congestion, no sore throat Cardiovascular: no chest pain Respiratory:  no shortness of breath Gastrointestinal:  no abdominal pain, no change in bowel habits GU:  Female: negative for dysuria or pelvic pain Musculoskeletal/Extremities:  no pain of the joints Integumentary (Skin/Breast):  no abnormal skin lesions reported Neurologic:  no  headaches Endocrine:  denies fatigue Hematologic/Lymphatic:  No areas of easy bleeding  Exam BP 126/84   Pulse 81   Temp 98.6 F (37 C)   Resp 20   Ht 5\' 4"  (1.626 m)   Wt 197 lb 3.2 oz (89.4 kg)   LMP 03/01/2023 (Exact Date)   SpO2 99%   BMI 33.85 kg/m  General:  well developed, well nourished, in no apparent distress Skin:  no significant moles, warts, or growths Head:  no masses, lesions, or tenderness Eyes:  pupils equal and round, sclera anicteric without injection Ears:  canals without lesions, TMs shiny without retraction, no obvious effusion, no erythema Nose:  nares patent, mucosa normal, and no drainage Throat/Pharynx:  lips and gingiva without lesion; tongue and uvula midline; non-inflamed pharynx; no exudates or postnasal drainage Neck: neck supple without adenopathy, thyromegaly, or masses Lungs:  clear to auscultation, breath sounds equal bilaterally, no respiratory distress Cardio:  regular rate and rhythm, no LE edema Abdomen:  abdomen soft, nontender; bowel sounds normal; no masses or organomegaly Genital: Defer to GYN Musculoskeletal:  symmetrical muscle groups noted without atrophy or deformity Extremities:  no clubbing, cyanosis, or edema, no deformities, no skin discoloration Neuro:  gait normal; deep tendon reflexes normal and symmetric Psych: well oriented with normal range of affect and appropriate judgment/insight  Assessment and Plan  Well adult exam - Plan: Comprehensive metabolic panel, Lipid panel, CBC w/Diff  Screen for colon cancer -  Plan: Ambulatory referral to Gastroenterology   Well 48 y.o. female. Counseled on diet and exercise. CCS: Refer to GI Other orders as above. Follow up in 6 mo. The patient voiced understanding and agreement to the plan.  Jilda Roche [place], DO 03/05/23 1:58 PM

## 2023-03-06 ENCOUNTER — Encounter: Payer: Self-pay | Admitting: Family Medicine

## 2023-03-06 LAB — CBC WITH DIFFERENTIAL/PLATELET
Absolute Lymphocytes: 4268 {cells}/uL — ABNORMAL HIGH (ref 850–3900)
Absolute Monocytes: 627 {cells}/uL (ref 200–950)
Basophils Absolute: 62 {cells}/uL (ref 0–200)
Basophils Relative: 0.5 %
Eosinophils Absolute: 185 {cells}/uL (ref 15–500)
Eosinophils Relative: 1.5 %
HCT: 39 % (ref 35.0–45.0)
Hemoglobin: 12.4 g/dL (ref 11.7–15.5)
MCH: 25.4 pg — ABNORMAL LOW (ref 27.0–33.0)
MCHC: 31.8 g/dL — ABNORMAL LOW (ref 32.0–36.0)
MCV: 79.9 fL — ABNORMAL LOW (ref 80.0–100.0)
MPV: 10.8 fL (ref 7.5–12.5)
Monocytes Relative: 5.1 %
Neutro Abs: 7159 {cells}/uL (ref 1500–7800)
Neutrophils Relative %: 58.2 %
Platelets: 422 10*3/uL — ABNORMAL HIGH (ref 140–400)
RBC: 4.88 10*6/uL (ref 3.80–5.10)
RDW: 14.8 % (ref 11.0–15.0)
Total Lymphocyte: 34.7 %
WBC: 12.3 10*3/uL — ABNORMAL HIGH (ref 3.8–10.8)

## 2023-03-06 LAB — COMPREHENSIVE METABOLIC PANEL
AG Ratio: 1.6 (calc) (ref 1.0–2.5)
ALT: 14 U/L (ref 6–29)
AST: 13 U/L (ref 10–35)
Albumin: 4.7 g/dL (ref 3.6–5.1)
Alkaline phosphatase (APISO): 121 U/L (ref 31–125)
BUN/Creatinine Ratio: 8 (calc) (ref 6–22)
BUN: 6 mg/dL — ABNORMAL LOW (ref 7–25)
CO2: 30 mmol/L (ref 20–32)
Calcium: 9.9 mg/dL (ref 8.6–10.2)
Chloride: 100 mmol/L (ref 98–110)
Creat: 0.8 mg/dL (ref 0.50–0.99)
Globulin: 2.9 g/dL (ref 1.9–3.7)
Glucose, Bld: 83 mg/dL (ref 65–99)
Potassium: 3.6 mmol/L (ref 3.5–5.3)
Sodium: 141 mmol/L (ref 135–146)
Total Bilirubin: 0.5 mg/dL (ref 0.2–1.2)
Total Protein: 7.6 g/dL (ref 6.1–8.1)

## 2023-03-06 LAB — LIPID PANEL
Cholesterol: 188 mg/dL (ref <200)
HDL: 67 mg/dL (ref >=50)
LDL Cholesterol (Calc): 99 mg/dL
Non-HDL Cholesterol (Calc): 121 mg/dL (ref <130)
Total CHOL/HDL Ratio: 2.8 (calc) (ref <5.0)
Triglycerides: 128 mg/dL (ref <150)

## 2023-03-15 ENCOUNTER — Telehealth: Payer: Self-pay

## 2023-03-15 ENCOUNTER — Encounter: Payer: Self-pay | Admitting: Gastroenterology

## 2023-03-15 NOTE — Telephone Encounter (Signed)
 Copied from CRM [id number]. Topic: Clinical - Medical Advice >> Mar 15, 2023 12:22 PM [place] J wrote: Reason for CRM: Patient wanted to update Dr. Carmelia Roller that she did not hear anything back from the gastroenterology clinic. Patient was told to give an update and she would like to know next steps.

## 2023-03-18 NOTE — Telephone Encounter (Signed)
 Pt called and lvm to return to give her [facility] info  [address], [place], [place] [address] [phone]

## 2023-04-04 ENCOUNTER — Other Ambulatory Visit: Payer: Self-pay | Admitting: Family Medicine

## 2023-04-05 ENCOUNTER — Other Ambulatory Visit (HOSPITAL_COMMUNITY): Payer: Self-pay

## 2023-04-05 MED ORDER — DULOXETINE HCL 60 MG PO CPEP
60.0000 mg | ORAL_CAPSULE | Freq: Every day | ORAL | 1 refills | Status: DC
  Filled 2023-04-05: qty 75, 75d supply, fill #0
  Filled 2023-04-05: qty 15, 15d supply, fill #0
  Filled 2023-07-09: qty 90, 90d supply, fill #1

## 2023-04-06 ENCOUNTER — Ambulatory Visit (AMBULATORY_SURGERY_CENTER)

## 2023-04-06 VITALS — Ht 64.0 in | Wt 196.0 lb

## 2023-04-06 DIAGNOSIS — Z1211 Encounter for screening for malignant neoplasm of colon: Secondary | ICD-10-CM

## 2023-04-06 MED ORDER — SUTAB 1479-225-188 MG PO TABS: ORAL_TABLET | ORAL | 0 refills | Status: DC

## 2023-04-06 NOTE — Progress Notes (Signed)
 No egg or soy allergy known to patient  No issues known to pt with past sedation with any surgeries or procedures Patient denies ever being told they had issues or difficulty with intubation  No FH of Malignant Hyperthermia Pt is not on diet pills nor GLP-1 medications Pt is not on home 02  Pt is not on blood thinners  Pt denies issues with chronic constipation  No A fib or A flutter Have any cardiac testing pending--no Ambulates independently

## 2023-04-28 ENCOUNTER — Encounter: Payer: Self-pay | Admitting: Gastroenterology

## 2023-04-30 ENCOUNTER — Ambulatory Visit (AMBULATORY_SURGERY_CENTER): Admitting: Gastroenterology

## 2023-04-30 ENCOUNTER — Encounter: Payer: Self-pay | Admitting: Gastroenterology

## 2023-04-30 VITALS — BP 123/78 | HR 64 | Temp 97.4°F | Resp 15 | Ht 64.0 in | Wt 196.0 lb

## 2023-04-30 DIAGNOSIS — D122 Benign neoplasm of ascending colon: Secondary | ICD-10-CM

## 2023-04-30 DIAGNOSIS — D120 Benign neoplasm of cecum: Secondary | ICD-10-CM

## 2023-04-30 DIAGNOSIS — K5730 Diverticulosis of large intestine without perforation or abscess without bleeding: Secondary | ICD-10-CM

## 2023-04-30 DIAGNOSIS — K648 Other hemorrhoids: Secondary | ICD-10-CM

## 2023-04-30 DIAGNOSIS — K644 Residual hemorrhoidal skin tags: Secondary | ICD-10-CM | POA: Diagnosis not present

## 2023-04-30 DIAGNOSIS — Z1211 Encounter for screening for malignant neoplasm of colon: Secondary | ICD-10-CM

## 2023-04-30 MED ORDER — SODIUM CHLORIDE 0.9 % IV SOLN: 500.0000 mL | INTRAVENOUS | Status: DC

## 2023-04-30 NOTE — Progress Notes (Signed)
 [facility] History and Physical   Primary Care Physician:  Jobe Mulder, DO   Reason for Procedure:  Colorectal cancer screening  Plan:    Screening colonoscopy with possible interventions as needed     HPI: Stacey Conner is a very pleasant 48 y.o. female here for screening colonoscopy. Denies any nausea, vomiting, abdominal pain, melena or bright red blood per rectum  The risks and benefits as well as alternatives of endoscopic procedure(s) have been discussed and reviewed. All questions answered. The patient agrees to proceed.    Past Medical History:  Diagnosis Date   Allergy    Frequent headaches    Migraine    Thyroid  disease    Vaginal Pap smear, abnormal 2006    Past Surgical History:  Procedure Laterality Date   ECTOPIC PREGNANCY SURGERY  2016   ECTOPIC PREGNANCY SURGERY  2023   LEEP  2006   TONSILLECTOMY  1984    Prior to Admission medications   Medication Sig Start Date End Date Taking? Authorizing Provider  amLODipine  (NORVASC ) 10 MG tablet Take 1 tablet (10 mg total) by mouth daily. 01/25/23  Yes Jobe Mulder, DO  cetirizine (ZYRTEC) 10 MG tablet Take 10 mg by mouth daily.   Yes [provider]  DULoxetine  (CYMBALTA ) 60 MG capsule Take 1 capsule (60 mg total) by mouth daily. 04/05/23  Yes Jobe Mulder, DO  Sodium Sulfate-Mag Sulfate-KCl (SUTAB ) 1479-225-188 MG TABS Use as directed for colonoscopy. MANUFACTURER CODES!! BIN: [id number] PCN: CN GROUP: [id number] MEMBER ID: [id number];ION AS SECONDARY INSURANCE ;NO PRIOR AUTHORIZATION 04/06/23  Yes Imogen Maddalena V, MD  ibuprofen (ADVIL) 400 MG tablet Take 400 mg by mouth every 6 (six) hours as needed.    [provider]    Current Outpatient Medications  Medication Sig Dispense Refill   amLODipine  (NORVASC ) 10 MG tablet Take 1 tablet (10 mg total) by mouth daily. 90 tablet 3   cetirizine (ZYRTEC) 10 MG tablet Take 10 mg by mouth daily.      DULoxetine  (CYMBALTA ) 60 MG capsule Take 1 capsule (60 mg total) by mouth daily. 90 capsule 1   Sodium Sulfate-Mag Sulfate-KCl (SUTAB ) 1479-225-188 MG TABS Use as directed for colonoscopy. MANUFACTURER CODES!! BIN: [id number] PCN: CN GROUP: [id number] MEMBER ID: [id number];GUY AS SECONDARY INSURANCE ;NO PRIOR AUTHORIZATION 24 tablet 0   ibuprofen (ADVIL) 400 MG tablet Take 400 mg by mouth every 6 (six) hours as needed.     Current Facility-Administered Medications  Medication Dose Route Frequency Provider Last Rate Last Admin   0.9 %  sodium chloride infusion  500 mL Intravenous Continuous Jaevin Medearis V, MD        Allergies as of 04/30/2023 - Review Complete 04/30/2023  Allergen Reaction Noted   Sulfa antibiotics Hives and Other (See Comments) 10/22/2017    Family History  Problem Relation Age of Onset   Colon polyps Mother    Arthritis Mother    Depression Mother    Hyperlipidemia Mother    Hypertension Mother    Colon polyps Father    Arthritis Father    Diabetes Father    Depression Father    Colon polyps Maternal Uncle    Asthma Son    Colon cancer Neg Hx    Esophageal cancer Neg Hx    Rectal cancer Neg Hx    Stomach cancer Neg Hx     Social History   Socioeconomic History   Marital status: Single    Spouse name: Not on file  Number of children: Not on file   Years of education: Not on file   Highest education level: Bachelor's degree (e.g., BA, AB, BS)  Occupational History   Not on file  Tobacco Use   Smoking status: Former    Types: Cigarettes   Smokeless tobacco: Never  Vaping Use   Vaping status: Every Day  Substance and Sexual Activity   Alcohol use: Yes    Comment: occasional   Drug use: Never   Sexual activity: Yes  Other Topics Concern   Not on file  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   Social Drivers of Health   Financial Resource Strain: Low Risk  (03/05/2023)   Overall Financial Resource Strain (CARDIA)    Difficulty of Paying  Living Expenses: Not hard at all  Food Insecurity: No Food Insecurity (03/05/2023)   Hunger Vital Sign    Worried About Running Out of Food in the Last Year: Never true    Ran Out of Food in the Last Year: Never true  Transportation Needs: No Transportation Needs (03/05/2023)   PRAPARE - Administrator, Civil Service (Medical): No    Lack of Transportation (Non-Medical): No  Physical Activity: Insufficiently Active (03/05/2023)   Exercise Vital Sign    Days of Exercise per Week: 3 days    Minutes of Exercise per Session: 20 min  Stress: Stress Concern Present (03/05/2023)   [employer] of Occupational Health - Occupational Stress Questionnaire    Feeling of Stress : Rather much  Social Connections: Moderately Isolated (03/05/2023)   Social Connection and Isolation Panel [NHANES]    Frequency of Communication with Friends and Family: More than three times a week    Frequency of Social Gatherings with Friends and Family: Once a week    Attends Religious Services: 1 to 4 times per year    Active Member of [employer] or Organizations: No    Attends Engineer, structural: Not on file    Marital Status: Never married  Intimate Partner Violence: Not on file    Review of Systems:  All other review of systems negative except as mentioned in the HPI.  Physical Exam: Vital signs in last 24 hours: BP (!) 120/58   Pulse 65   Temp (!) 97.4 F (36.3 C) (Skin)   Ht 5\' 4"  (1.626 m)   Wt 196 lb (88.9 kg)   SpO2 100%   BMI 33.64 kg/m  General:   Alert, NAD Lungs:  Clear .   Heart:  Regular rate and rhythm Abdomen:  Soft, nontender and nondistended. Neuro/Psych:  Alert and cooperative. Normal mood and affect. A and O x 3  Reviewed labs, radiology imaging, old records and pertinent past GI work up  Patient is appropriate for planned procedure(s) and anesthesia in an ambulatory setting   K. Veena Merwyn Hodapp , MD [phone]

## 2023-04-30 NOTE — Patient Instructions (Addendum)
-  Handout on polyps, diverticulosis and hemorrhoids provided -await pathology results -repeat colonoscopy for surveillance in 5-10 years recommended  -Continue present medications    YOU HAD AN ENDOSCOPIC PROCEDURE TODAY AT THE [facility]:   Refer to the procedure report that was given to you for any specific questions about what was found during the examination.  If the procedure report does not answer your questions, please call your gastroenterologist to clarify.  If you requested that your care partner not be given the details of your procedure findings, then the procedure report has been included in a sealed envelope for you to review at your convenience later.  YOU SHOULD EXPECT: Some feelings of bloating in the abdomen. Passage of more gas than usual.  Walking can help get rid of the air that was put into your GI tract during the procedure and reduce the bloating. If you had a lower endoscopy (such as a colonoscopy or flexible sigmoidoscopy) you may notice spotting of blood in your stool or on the toilet paper. If you underwent a bowel prep for your procedure, you may not have a normal bowel movement for a few days.  Please Note:  You might notice some irritation and congestion in your nose or some drainage.  This is from the oxygen used during your procedure.  There is no need for concern and it should clear up in a day or so.  SYMPTOMS TO REPORT IMMEDIATELY:  Following lower endoscopy (colonoscopy or flexible sigmoidoscopy):  Excessive amounts of blood in the stool  Significant tenderness or worsening of abdominal pains  Swelling of the abdomen that is new, acute  Fever of 100F or higher  For urgent or emergent issues, a gastroenterologist can be reached at any hour by calling [phone]. Do not use MyChart messaging for urgent concerns.    DIET:  We do recommend a small meal at first, but then you may proceed to your regular diet.  Drink plenty of fluids but you  should avoid alcoholic beverages for 24 hours.  ACTIVITY:  You should plan to take it easy for the rest of today and you should NOT DRIVE or use heavy machinery until tomorrow (because of the sedation medicines used during the test).    FOLLOW UP: Our staff will call the number listed on your records the next business day following your procedure.  We will call around 7:15- 8:00 am to check on you and address any questions or concerns that you may have regarding the information given to you following your procedure. If we do not reach you, we will leave a message.     If any biopsies were taken you will be contacted by phone or by letter within the next 1-3 weeks.  Please call us  at [phone] if you have not heard about the biopsies in 3 weeks.    SIGNATURES/CONFIDENTIALITY: You and/or your care partner have signed paperwork which will be entered into your electronic medical record.  These signatures attest to the fact that that the information above on your After Visit Summary has been reviewed and is understood.  Full responsibility of the confidentiality of this discharge information lies with you and/or your care-partner.

## 2023-04-30 NOTE — Progress Notes (Signed)
 Report to PACU, RN, vss, BBS= Clear.

## 2023-04-30 NOTE — Progress Notes (Signed)
 Called to room to assist during endoscopic procedure.  Patient ID and intended procedure confirmed with present staff. Received instructions for my participation in the procedure from the performing physician.

## 2023-04-30 NOTE — Progress Notes (Signed)
 Patient states there have been no changes to medical or surgical history since time of pre-visit.

## 2023-04-30 NOTE — Op Note (Signed)
  [facility] Patient Name: Stacey Conner Procedure Date: 04/30/2023 8:11 AM MRN: [id number] Endoscopist: Sergio Dandy , MD, [id number] Age: 48 Referring MD:  Date of Birth: Oct 18, 1975 Gender: Female Account #: [id number] Procedure:                Colonoscopy Indications:              Screening for colorectal malignant neoplasm Medicines:                Monitored Anesthesia Care Procedure:                Pre-Anesthesia Assessment:                           - Prior to the procedure, a History and Physical                            was performed, and patient medications and                            allergies were reviewed. The patient's tolerance of                            previous anesthesia was also reviewed. The risks                            and benefits of the procedure and the sedation                            options and risks were discussed with the patient.                            All questions were answered, and informed consent                            was obtained. Prior Anticoagulants: The patient has                            taken no anticoagulant or antiplatelet agents. ASA                            Grade Assessment: II - A patient with mild systemic                            disease. After reviewing the risks and benefits,                            the patient was deemed in satisfactory condition to                            undergo the procedure.                           After obtaining informed consent, the colonoscope  was passed under direct vision. Throughout the                            procedure, the patient's blood pressure, pulse, and                            oxygen saturations were monitored continuously. The                            PCF-HQ190L Colonoscope [id number] was introduced                            through the anus and advanced to the the cecum,                            identified by  appendiceal orifice and ileocecal                            valve. The colonoscopy was performed without                            difficulty. The patient tolerated the procedure                            well. The quality of the bowel preparation was                            good. The ileocecal valve, appendiceal orifice, and                            rectum were photographed. Scope In: 8:18:37 AM Scope Out: 8:28:40 AM Scope Withdrawal Time: 0 hours 7 minutes 29 seconds  Total Procedure Duration: 0 hours 10 minutes 3 seconds  Findings:                 The perianal and digital rectal examinations were                            normal.                           Two sessile polyps were found in the ascending                            colon and cecum. The polyps were 3 to 7 mm in size.                            These polyps were removed with a cold snare.                            Resection and retrieval were complete.                           A few small-mouthed diverticula were found in the  sigmoid colon.                           Non-bleeding external and internal hemorrhoids were                            found during retroflexion. The hemorrhoids were                            medium-sized. Complications:            No immediate complications. Estimated Blood Loss:     Estimated blood loss was minimal. Impression:               - Two 3 to 7 mm polyps in the ascending colon and                            in the cecum, removed with a cold snare. Resected                            and retrieved.                           - Diverticulosis in the sigmoid colon.                           - Non-bleeding external and internal hemorrhoids. Recommendation:           - Patient has a contact number available for                            emergencies. The signs and symptoms of potential                            delayed complications were discussed with  the                            patient. Return to normal activities tomorrow.                            Written discharge instructions were provided to the                            patient.                           - Resume previous diet.                           - Continue present medications.                           - Await pathology results.                           - Repeat colonoscopy in 5-10 years for surveillance  based on pathology results. Stacey Kielty V. Makiyah Zentz, MD 04/30/2023 8:37:05 AM This report has been signed electronically.

## 2023-05-03 ENCOUNTER — Telehealth: Payer: Self-pay

## 2023-05-03 NOTE — Telephone Encounter (Signed)
Attempted f/u call. Left VM. 

## 2023-05-05 LAB — SURGICAL PATHOLOGY

## 2023-05-14 ENCOUNTER — Other Ambulatory Visit (HOSPITAL_COMMUNITY): Payer: Self-pay

## 2023-06-01 ENCOUNTER — Ambulatory Visit: Payer: Self-pay | Admitting: Gastroenterology

## 2023-06-01 DIAGNOSIS — Z9079 Acquired absence of other genital organ(s): Secondary | ICD-10-CM | POA: Diagnosis not present

## 2023-06-01 DIAGNOSIS — Z1151 Encounter for screening for human papillomavirus (HPV): Secondary | ICD-10-CM | POA: Diagnosis not present

## 2023-06-01 DIAGNOSIS — Z1231 Encounter for screening mammogram for malignant neoplasm of breast: Secondary | ICD-10-CM | POA: Diagnosis not present

## 2023-06-01 DIAGNOSIS — Z113 Encounter for screening for infections with a predominantly sexual mode of transmission: Secondary | ICD-10-CM | POA: Diagnosis not present

## 2023-06-01 DIAGNOSIS — Z01419 Encounter for gynecological examination (general) (routine) without abnormal findings: Secondary | ICD-10-CM | POA: Diagnosis not present

## 2023-06-01 DIAGNOSIS — R21 Rash and other nonspecific skin eruption: Secondary | ICD-10-CM | POA: Diagnosis not present

## 2023-06-15 DIAGNOSIS — Z1231 Encounter for screening mammogram for malignant neoplasm of breast: Secondary | ICD-10-CM | POA: Diagnosis not present

## 2023-07-13 ENCOUNTER — Ambulatory Visit (INDEPENDENT_AMBULATORY_CARE_PROVIDER_SITE_OTHER): Admitting: Family Medicine

## 2023-07-13 ENCOUNTER — Encounter: Payer: Self-pay | Admitting: Family Medicine

## 2023-07-13 VITALS — BP 110/78 | HR 98 | Temp 98.0°F | Resp 16 | Ht 64.0 in | Wt 197.0 lb

## 2023-07-13 DIAGNOSIS — M7712 Lateral epicondylitis, left elbow: Secondary | ICD-10-CM

## 2023-07-13 DIAGNOSIS — M67922 Unspecified disorder of synovium and tendon, left upper arm: Secondary | ICD-10-CM | POA: Diagnosis not present

## 2023-07-13 DIAGNOSIS — S46812A Strain of other muscles, fascia and tendons at shoulder and upper arm level, left arm, initial encounter: Secondary | ICD-10-CM | POA: Diagnosis not present

## 2023-07-13 MED ORDER — METHOCARBAMOL 500 MG PO TABS: 500.0000 mg | ORAL_TABLET | Freq: Three times a day (TID) | ORAL | 0 refills | Status: DC | PRN

## 2023-07-13 NOTE — Patient Instructions (Addendum)
 Heat (pad or rice pillow in microwave) over affected area, 10-15 minutes twice daily.   Ice/cold pack over area for 10-15 min twice daily.  OK to take Tylenol 1000 mg (2 extra strength tabs) or 975 mg (3 regular strength tabs) every 6 hours as needed.  Ibuprofen 400-600 mg (2-3 over the counter strength tabs) every 6 hours as needed for pain.  Send me a message in 3-4 weeks if not improving.  Consider a forearm strap (Band-IT) to help with your elbow. This can give your elbow a break and allow it to heal faster.  Let us  know if you need anything.

## 2023-07-13 NOTE — Progress Notes (Signed)
 Musculoskeletal Exam  Patient: Stacey Conner DOB: [birth year]  DOS: 07/13/2023  SUBJECTIVE:  Chief Complaint:   Chief Complaint  Patient presents with   Arm Pain    Left Arm Pain    Stacey Conner is a 48 y.o.  female for evaluation and treatment of L upper arm pain.   Onset:  3 months ago. Woke up in pain.  Location: Elbow and front of arm (biceps) Character:  burning, sharp, and tingling  Progression of issue:  has worsened Associated symptoms: pain with the end of ROM.  No bruising, redness, swelling Treatment: to date has been ice, OTC NSAIDS, acetaminophen, muscle relaxers, and heat.   Neurovascular symptoms: no  Past Medical History:  Diagnosis Date   Allergy    Frequent headaches    Migraine    Thyroid  disease    Vaginal Pap smear, abnormal 2006    Objective: VITAL SIGNS: BP 110/78 (BP Location: Left Arm, Patient Position: Sitting)   Pulse 98   Temp 98 F (36.7 C) (Oral)   Resp 16   Ht 5' 4 (1.626 m)   Wt 197 lb (89.4 kg)   SpO2 97%   BMI 33.81 kg/m  Constitutional: Well formed, well developed. No acute distress. Thorax & Lungs: No accessory muscle use Musculoskeletal: L elbow/shoulder.   Normal active range of motion: yes.   Normal passive range of motion: yes Tenderness to palpation: yes over the L trap, biceps/coracoid process, L trap Deformity: no Ecchymosis: no Tests positive: none Tests negative: Spurling's Neurologic: Normal sensory function. Psychiatric: Normal mood. Age appropriate judgment and insight. Alert & oriented x 3.    Assessment:  Trapezius strain, left, initial encounter - Plan: methocarbamol  (ROBAXIN ) 500 MG tablet  Lateral epicondylitis of left elbow - Plan: methocarbamol  (ROBAXIN ) 500 MG tablet  Tendinopathy of left biceps tendon - Plan: methocarbamol  (ROBAXIN ) 500 MG tablet  Plan: Chronic, not controlled.  Methocarbamol  500 mg 3 times daily as needed.  Stretches/exercises, heat, ice, Tylenol.  Chronic, not controlled.   Stretches and exercises, ice, Tylenol, forearm strap recommended. Chronic, not controlled.  Stretches and exercises, ice, heat, Tylenol, ibuprofen. If these are not improving over the next 3 to 4 weeks, she will send a message and we will set her up with physical therapy. F/u as originally scheduled. The patient voiced understanding and agreement to the plan.   Mabel Mt [place], DO 07/13/23  8:44 AM

## 2023-07-27 DIAGNOSIS — L308 Other specified dermatitis: Secondary | ICD-10-CM | POA: Diagnosis not present

## 2023-07-27 DIAGNOSIS — L2084 Intrinsic (allergic) eczema: Secondary | ICD-10-CM | POA: Diagnosis not present

## 2023-08-12 ENCOUNTER — Encounter: Payer: Self-pay | Admitting: Family Medicine

## 2023-08-12 ENCOUNTER — Other Ambulatory Visit (HOSPITAL_COMMUNITY): Payer: Self-pay

## 2023-08-17 ENCOUNTER — Other Ambulatory Visit: Payer: Self-pay

## 2023-08-17 DIAGNOSIS — M79602 Pain in left arm: Secondary | ICD-10-CM

## 2023-08-24 ENCOUNTER — Ambulatory Visit (INDEPENDENT_AMBULATORY_CARE_PROVIDER_SITE_OTHER): Admitting: Family Medicine

## 2023-08-24 ENCOUNTER — Encounter: Payer: Self-pay | Admitting: Family Medicine

## 2023-08-24 VITALS — BP 112/82 | Ht 64.0 in | Wt 195.0 lb

## 2023-08-24 DIAGNOSIS — M62830 Muscle spasm of back: Secondary | ICD-10-CM

## 2023-08-24 DIAGNOSIS — G5682 Other specified mononeuropathies of left upper limb: Secondary | ICD-10-CM

## 2023-08-24 DIAGNOSIS — D225 Melanocytic nevi of trunk: Secondary | ICD-10-CM

## 2023-08-24 NOTE — Progress Notes (Signed)
 PCP: Frann Mabel Mt, DO  Subjective:   HPI: Patient is a 48 y.o. female here after being referred by her PCP for further management of left upper extremity pain.  Patient says pain first began 3-4 months ago when she woke up after sleeping in uncomfortable position.  Has had intermittent but persistent neck/shoulder/left arm pain since then.  Patient saw her PCP on 07/13/2023 and was diagnosed with trapezius strain and lateral epicondylitis.  Was prescribed Robaxin , stretches and exercise, Tylenol, ibuprofen and instructed to start wearing a tennis elbow strap.  Patient says her tennis elbow has slowly began to improve with use of strap and other conservative modalities but she still has persistent left trapezius pain with occasional radicular/neuropathic pain down into her left arm.  Ibuprofen does partially relieve pain but it always comes back.  Patient works as a Engineer, civil (consulting) and she says pain is always worse at the end of her shift.  Denies any loss of strength in her left extremity or hand.  Past Medical History:  Diagnosis Date   Allergy    Frequent headaches    Migraine    Thyroid  disease    Vaginal Pap smear, abnormal 2006    Current Outpatient Medications on File Prior to Visit  Medication Sig Dispense Refill   amLODipine  (NORVASC ) 10 MG tablet Take 1 tablet (10 mg total) by mouth daily. 90 tablet 3   cetirizine (ZYRTEC) 10 MG tablet Take 10 mg by mouth daily.     DULoxetine  (CYMBALTA ) 60 MG capsule Take 1 capsule (60 mg total) by mouth daily. 90 capsule 1   ibuprofen (ADVIL) 400 MG tablet Take 400 mg by mouth every 6 (six) hours as needed.     methocarbamol  (ROBAXIN ) 500 MG tablet Take 1 tablet (500 mg total) by mouth every 8 (eight) hours as needed for muscle spasms. 21 tablet 0   No current facility-administered medications on file prior to visit.    Past Surgical History:  Procedure Laterality Date   ECTOPIC PREGNANCY SURGERY  2016   ECTOPIC PREGNANCY SURGERY  2023   LEEP   2006   TONSILLECTOMY  1984    Allergies  Allergen Reactions   Sulfa Antibiotics Hives and Other (See Comments)    vomits    BP 112/82   Ht 5' 4 (1.626 m)   Wt 195 lb (88.5 kg)   BMI 33.47 kg/m       No data to display              No data to display              Objective:  Physical Exam:  Gen: NAD, comfortable in exam room  On inspection of patient's left upper back musculature and left shoulder no obvious signs of trauma, ecchymosis, edema, or erythema.  Patient does have distinct tender/trigger points with subsequent radiation of pain throughout her left upper trapezius and levator scapula musculature.  Palpation of these trigger points reproduces left upper extremity pain and neuropathy.  Left shoulder examination reveals full active and passive range of motion.  Strength 5 out of 5 throughout rotator cuff musculature.  Neurovascularly intact.  Negative empty can.  Negative Hawkins.   Assessment & Plan:  #Spasm of left trapezius #Spasm of levator scapula muscle #Pinched nerve in upper back/shoulder - Treated patient today with trigger point injections for chronic upper back muscle spasm over the last 3 months.  Patient tolerated procedure well and endorsed 100% resolution of symptoms postprocedure -  Discussed etiology of neck muscle spasms and possible need for recurrent injections in 3-6 weeks if symptoms returns. - Can continue taking Robaxin , ibuprofen, Tylenol as needed for pain management - Give patient home exercise program for scapular retraction and depression and better postural positioning while working on computer. - Follow back up in 3 to 6 weeks as needed  PROCEDURE:  Risks & benefits of trigger point injections reviewed.  Consent obtained.  Time-out completed.  Patient prepped and draped in the normal fashion.  Area cleansed with chlorhexidine.  Ethyl chloride spray used to anesthetize the skin.  Solution of 1 mL 1% lidocaine without epinephrine  injected into (3) trigger point/points in the superior trapezius muscle and levator scapular muscle. Patient tolerated procedure well without any complications.  Area covered with adhesive bandage.  Post-procedure care reviewed, all questions answered.  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  She said she would discuss nevus with her PCP and obtain his opinion for neck step in care.

## 2023-08-25 ENCOUNTER — Encounter: Payer: Self-pay | Admitting: Family Medicine

## 2023-08-25 NOTE — Patient Instructions (Addendum)
 Today you received an injection with a trigger point injection with anesthetic (aka: numbing medication - Lidocaine). This injection is usually done in response to pain and inflammation. There is some numbing medicine (Lidocaine) in the shot, so the injected area may be numb and feel really good for the next couple of hours. The numbing medicine usually wears off in 2-3 hours, and then your pain level may be back to where it was before the injection.    Some patients may feel immediate relief after the injection, but for some the benefit from the trigger point injection may be noticed within 1-3 days.   Things to watch out for that you should contact us  or a health care provider urgently would include: 1. Unusual (as in more than 10%) increase in pain 2. New fever > 101.5 3. New swelling or redness of the injected area. 4. Streaking of red lines around the area injected.  Do not hesitate to call or reach out with any questions or concerns.

## 2023-09-03 ENCOUNTER — Ambulatory Visit: Payer: BC Managed Care – PPO | Admitting: Family Medicine

## 2023-09-07 ENCOUNTER — Ambulatory Visit (INDEPENDENT_AMBULATORY_CARE_PROVIDER_SITE_OTHER): Admitting: Family Medicine

## 2023-09-07 ENCOUNTER — Encounter: Payer: Self-pay | Admitting: Family Medicine

## 2023-09-07 VITALS — BP 110/74 | HR 100 | Temp 98.0°F | Resp 16 | Ht 64.0 in | Wt 195.8 lb

## 2023-09-07 DIAGNOSIS — M7712 Lateral epicondylitis, left elbow: Secondary | ICD-10-CM

## 2023-09-07 DIAGNOSIS — F325 Major depressive disorder, single episode, in full remission: Secondary | ICD-10-CM | POA: Diagnosis not present

## 2023-09-07 DIAGNOSIS — I10 Essential (primary) hypertension: Secondary | ICD-10-CM | POA: Diagnosis not present

## 2023-09-07 DIAGNOSIS — M67922 Unspecified disorder of synovium and tendon, left upper arm: Secondary | ICD-10-CM

## 2023-09-07 DIAGNOSIS — S46812A Strain of other muscles, fascia and tendons at shoulder and upper arm level, left arm, initial encounter: Secondary | ICD-10-CM

## 2023-09-07 DIAGNOSIS — F411 Generalized anxiety disorder: Secondary | ICD-10-CM

## 2023-09-07 MED ORDER — NORETHINDRONE ACETATE 5 MG PO TABS: 5.0000 mg | ORAL_TABLET | Freq: Every day | ORAL | 0 refills | Status: AC

## 2023-09-07 MED ORDER — METHOCARBAMOL 500 MG PO TABS: 500.0000 mg | ORAL_TABLET | Freq: Three times a day (TID) | ORAL | 0 refills | Status: AC | PRN

## 2023-09-07 MED ORDER — DULOXETINE HCL 60 MG PO CPEP: 60.0000 mg | ORAL_CAPSULE | Freq: Every day | ORAL | 1 refills | Status: DC

## 2023-09-07 NOTE — Progress Notes (Signed)
 Chief Complaint  Patient presents with   Follow-up    Follow Up    Subjective Stacey Conner is a 48 y.o. female who presents for hypertension follow up. She does monitor home blood pressures. Blood pressures ranging from 120's/80's on average. She is compliant with medications. Patient has these side effects of medication: none She is adhering to a healthy diet overall. Current exercise: elliptical, lifting wts No CP or SOB.   Depression/GAD Pt is currently being treated with Cymbalta  60 mg/d.  Reports doing well since treatment. No thoughts of harming self or others. No self-medication with alcohol, prescription drugs or illicit drugs. Pt is following with a counselor/psychologist.   Past Medical History:  Diagnosis Date   Allergy    Frequent headaches    Migraine    Thyroid  disease    Vaginal Pap smear, abnormal 2006    Exam BP 110/74 (BP Location: Left Arm, Patient Position: Sitting)   Pulse 100   Temp 98 F (36.7 C) (Oral)   Resp 16   Ht 5' 4 (1.626 m)   Wt 195 lb 12.8 oz (88.8 kg)   SpO2 97%   BMI 33.61 kg/m  General:  well developed, well nourished, in no apparent distress Heart: RRR, no bruits, no LE edema Lungs: clear to auscultation, no accessory muscle use Psych: well oriented with normal range of affect and appropriate judgment/insight  Primary hypertension  GAD (generalized anxiety disorder)  Depression, major, single episode, complete remission (HCC)  Chronic, stable. Cont Norvasc  10 mg/d. Counseled on diet and exercise. 2/3. Chronic, stable. Cont Cymbalta  60 mg/d.  F/u in 6 mo. The patient voiced understanding and agreement to the plan.  Mabel Mt [place], DO 09/07/23  12:44 PM

## 2023-09-07 NOTE — Patient Instructions (Signed)
 Keep the diet clean and stay active.  Let us know if you need anything.

## 2023-09-11 ENCOUNTER — Encounter: Payer: Self-pay | Admitting: Family Medicine

## 2023-10-26 ENCOUNTER — Other Ambulatory Visit: Payer: Self-pay | Admitting: Family Medicine

## 2023-10-26 DIAGNOSIS — F411 Generalized anxiety disorder: Secondary | ICD-10-CM

## 2023-10-26 DIAGNOSIS — F325 Major depressive disorder, single episode, in full remission: Secondary | ICD-10-CM

## 2023-12-14 DIAGNOSIS — Z8342 Family history of familial hypercholesterolemia: Secondary | ICD-10-CM | POA: Diagnosis not present

## 2023-12-14 DIAGNOSIS — Z6834 Body mass index (BMI) 34.0-34.9, adult: Secondary | ICD-10-CM | POA: Diagnosis not present

## 2023-12-14 DIAGNOSIS — Z8349 Family history of other endocrine, nutritional and metabolic diseases: Secondary | ICD-10-CM | POA: Diagnosis not present

## 2023-12-14 DIAGNOSIS — Z713 Dietary counseling and surveillance: Secondary | ICD-10-CM | POA: Diagnosis not present

## 2023-12-28 DIAGNOSIS — Z8342 Family history of familial hypercholesterolemia: Secondary | ICD-10-CM | POA: Diagnosis not present

## 2023-12-28 DIAGNOSIS — Z6834 Body mass index (BMI) 34.0-34.9, adult: Secondary | ICD-10-CM | POA: Diagnosis not present

## 2023-12-28 DIAGNOSIS — Z8349 Family history of other endocrine, nutritional and metabolic diseases: Secondary | ICD-10-CM | POA: Diagnosis not present

## 2023-12-28 DIAGNOSIS — Z713 Dietary counseling and surveillance: Secondary | ICD-10-CM | POA: Diagnosis not present

## 2024-03-07 ENCOUNTER — Encounter: Admitting: Family Medicine
